# Patient Record
Sex: Female | Born: 1975 | Hispanic: No | Marital: Married | Smoking: Never smoker
Health system: Southern US, Community
[De-identification: ages and names within clinical notes are randomized; demographics above are authoritative.]

## PROBLEM LIST (undated history)

## (undated) DIAGNOSIS — R002 Palpitations: Secondary | ICD-10-CM

## (undated) DIAGNOSIS — Z973 Presence of spectacles and contact lenses: Secondary | ICD-10-CM

## (undated) DIAGNOSIS — R42 Dizziness and giddiness: Secondary | ICD-10-CM

## (undated) DIAGNOSIS — Z8639 Personal history of other endocrine, nutritional and metabolic disease: Secondary | ICD-10-CM

## (undated) DIAGNOSIS — G253 Myoclonus: Secondary | ICD-10-CM

## (undated) DIAGNOSIS — K219 Gastro-esophageal reflux disease without esophagitis: Secondary | ICD-10-CM

## (undated) DIAGNOSIS — R519 Headache, unspecified: Secondary | ICD-10-CM

## (undated) DIAGNOSIS — N9489 Other specified conditions associated with female genital organs and menstrual cycle: Secondary | ICD-10-CM

## (undated) DIAGNOSIS — I82409 Acute embolism and thrombosis of unspecified deep veins of unspecified lower extremity: Secondary | ICD-10-CM

## (undated) DIAGNOSIS — R51 Headache: Secondary | ICD-10-CM

## (undated) DIAGNOSIS — E039 Hypothyroidism, unspecified: Secondary | ICD-10-CM

## (undated) DIAGNOSIS — E079 Disorder of thyroid, unspecified: Secondary | ICD-10-CM

## (undated) DIAGNOSIS — N920 Excessive and frequent menstruation with regular cycle: Secondary | ICD-10-CM

## (undated) DIAGNOSIS — E559 Vitamin D deficiency, unspecified: Secondary | ICD-10-CM

## (undated) HISTORY — DX: Vitamin D deficiency, unspecified: E55.9

## (undated) HISTORY — DX: Headache: R51

## (undated) HISTORY — DX: Headache, unspecified: R51.9

## (undated) HISTORY — DX: Dizziness and giddiness: R42

## (undated) HISTORY — DX: Myoclonus: G25.3

## (undated) HISTORY — DX: Disorder of thyroid, unspecified: E07.9

---
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
## 1997-05-21 HISTORY — PX: INGUINAL HERNIA REPAIR: SUR1180

## 2003-03-06 ENCOUNTER — Emergency Department (HOSPITAL_COMMUNITY): Admission: EM | Admit: 2003-03-06 | Discharge: 2003-03-06 | Payer: Self-pay | Admitting: Emergency Medicine

## 2003-04-21 ENCOUNTER — Other Ambulatory Visit: Admission: RE | Admit: 2003-04-21 | Discharge: 2003-04-21 | Payer: Self-pay | Admitting: Obstetrics and Gynecology

## 2003-11-11 ENCOUNTER — Inpatient Hospital Stay (HOSPITAL_COMMUNITY): Admission: AD | Admit: 2003-11-11 | Discharge: 2003-11-11 | Payer: Self-pay | Admitting: Obstetrics and Gynecology

## 2003-11-24 ENCOUNTER — Inpatient Hospital Stay (HOSPITAL_COMMUNITY): Admission: AD | Admit: 2003-11-24 | Discharge: 2003-11-28 | Payer: Self-pay | Admitting: Obstetrics and Gynecology

## 2006-07-22 ENCOUNTER — Encounter: Admission: RE | Admit: 2006-07-22 | Discharge: 2006-07-22 | Payer: Self-pay | Admitting: Family Medicine

## 2011-01-08 ENCOUNTER — Other Ambulatory Visit: Payer: Self-pay | Admitting: Family Medicine

## 2011-01-08 DIAGNOSIS — N644 Mastodynia: Secondary | ICD-10-CM

## 2011-01-12 ENCOUNTER — Ambulatory Visit
Admission: RE | Admit: 2011-01-12 | Discharge: 2011-01-12 | Disposition: A | Discharge status: Home / Self Care | Payer: BC Managed Care – PPO | Source: Ambulatory Visit | Attending: Family Medicine | Admitting: Family Medicine

## 2011-01-12 DIAGNOSIS — N644 Mastodynia: Secondary | ICD-10-CM

## 2011-10-26 ENCOUNTER — Ambulatory Visit: Payer: BC Managed Care – PPO | Admitting: Physical Therapy

## 2011-11-05 ENCOUNTER — Ambulatory Visit: Payer: BC Managed Care – PPO | Admitting: Physical Therapy

## 2013-02-12 ENCOUNTER — Other Ambulatory Visit (HOSPITAL_COMMUNITY): Payer: Self-pay | Admitting: Endocrinology

## 2013-02-12 DIAGNOSIS — E0590 Thyrotoxicosis, unspecified without thyrotoxic crisis or storm: Secondary | ICD-10-CM

## 2013-02-18 ENCOUNTER — Encounter (HOSPITAL_COMMUNITY): Payer: Self-pay | Admitting: *Deleted

## 2013-03-04 ENCOUNTER — Ambulatory Visit (HOSPITAL_COMMUNITY): Payer: BC Managed Care – PPO

## 2013-03-05 ENCOUNTER — Other Ambulatory Visit (HOSPITAL_COMMUNITY): Payer: BC Managed Care – PPO

## 2013-03-11 ENCOUNTER — Encounter (HOSPITAL_COMMUNITY): Payer: Self-pay | Admitting: Pharmacist

## 2013-03-12 ENCOUNTER — Encounter (HOSPITAL_COMMUNITY)
Admission: RE | Admit: 2013-03-12 | Discharge: 2013-03-12 | Disposition: A | Discharge status: Home / Self Care | Payer: BC Managed Care – PPO | Source: Ambulatory Visit | Attending: Endocrinology | Admitting: Endocrinology

## 2013-03-12 DIAGNOSIS — E0590 Thyrotoxicosis, unspecified without thyrotoxic crisis or storm: Secondary | ICD-10-CM | POA: Insufficient documentation

## 2013-03-13 ENCOUNTER — Other Ambulatory Visit: Payer: Self-pay | Admitting: Obstetrics and Gynecology

## 2013-03-13 ENCOUNTER — Encounter (HOSPITAL_COMMUNITY)
Admission: RE | Admit: 2013-03-13 | Discharge: 2013-03-13 | Disposition: A | Discharge status: Home / Self Care | Payer: BC Managed Care – PPO | Source: Ambulatory Visit | Attending: Endocrinology | Admitting: Endocrinology

## 2013-03-13 MED ORDER — SODIUM IODIDE I 131 CAPSULE
14.8000 | Freq: Once | INTRAVENOUS | Status: AC | PRN
  Administered 2013-03-13: 14.8 via ORAL

## 2013-03-13 MED ORDER — SODIUM PERTECHNETATE TC 99M INJECTION
10.8000 | Freq: Once | INTRAVENOUS | Status: AC | PRN
Start: 2013-03-13 — End: 2013-03-13
  Administered 2013-03-13: 11 via INTRAVENOUS

## 2013-03-14 ENCOUNTER — Observation Stay (HOSPITAL_COMMUNITY)
Admission: EM | Admit: 2013-03-14 | Discharge: 2013-03-16 | Disposition: A | Discharge status: Home / Self Care | Payer: BC Managed Care – PPO | Attending: General Surgery | Admitting: General Surgery

## 2013-03-14 ENCOUNTER — Encounter (HOSPITAL_COMMUNITY): Payer: Self-pay | Admitting: Emergency Medicine

## 2013-03-14 DIAGNOSIS — K612 Anorectal abscess: Principal | ICD-10-CM | POA: Insufficient documentation

## 2013-03-14 DIAGNOSIS — K611 Rectal abscess: Secondary | ICD-10-CM

## 2013-03-14 DIAGNOSIS — K649 Unspecified hemorrhoids: Secondary | ICD-10-CM | POA: Insufficient documentation

## 2013-03-14 DIAGNOSIS — E0590 Thyrotoxicosis, unspecified without thyrotoxic crisis or storm: Secondary | ICD-10-CM | POA: Insufficient documentation

## 2013-03-14 LAB — CBC
MCH: 29.6 pg (ref 26.0–34.0)
MCHC: 34.7 g/dL (ref 30.0–36.0)
MCV: 85.2 fL (ref 78.0–100.0)
RBC: 4.67 MIL/uL (ref 3.87–5.11)

## 2013-03-14 LAB — BASIC METABOLIC PANEL
BUN: 7 mg/dL (ref 6–23)
Calcium: 9.5 mg/dL (ref 8.4–10.5)
GFR calc Af Amer: >90 mL/min (ref >90)
Sodium: 136 mEq/L (ref 135–145)

## 2013-03-14 MED ORDER — SODIUM CHLORIDE 0.9 % IV SOLN
3.0000 g | Freq: Four times a day (QID) | INTRAVENOUS | Status: DC
  Administered 2013-03-14 – 2013-03-16 (×6): 3 g via INTRAVENOUS
  Filled 2013-03-14 (×9): qty 3

## 2013-03-14 MED ORDER — SODIUM CHLORIDE 0.9 % IV SOLN
INTRAVENOUS | Status: DC
  Administered 2013-03-14: 21:00:00 via INTRAVENOUS

## 2013-03-14 MED ORDER — OXYCODONE HCL 5 MG PO TABS
5.0000 mg | ORAL_TABLET | ORAL | Status: DC | PRN
  Administered 2013-03-15 – 2013-03-16 (×2): 5 mg via ORAL
  Filled 2013-03-14 (×2): qty 1

## 2013-03-14 MED ORDER — MORPHINE SULFATE 4 MG/ML IJ SOLN
4.0000 mg | Freq: Once | INTRAMUSCULAR | Status: AC
  Administered 2013-03-14: 4 mg via INTRAMUSCULAR
  Filled 2013-03-14: qty 1

## 2013-03-14 MED ORDER — MORPHINE SULFATE 2 MG/ML IJ SOLN: 1.0000 mg | INTRAMUSCULAR | Status: DC | PRN

## 2013-03-14 NOTE — H&P (Signed)
Reason for Consult:perirectal abscess Referring Physician: Sharilyn Sites, PA  Jo Mitchell is an 37 y.o. female.  HPI: was asked to evaluate this patient for possible perirectal abscess. 10 years ago she says that she had a perirectal abscess which required incision and drainage. This relieved her symptoms but she had persistent drainage from the I&D site consistent with a fistula in ano.  With nonoperative management this eventually closed and she has not had any symptoms since then. On Monday she had exquisite pain after a bowel movement and she saw her primary care physician and she was told that she had an anal fissure. She denies any blood in the stools or other rectal bleeding. She has had intermittent pain after bowel movements throughout the week. Her most severe pain began earlier this morning to the point which brought her to the emergency room. She says that this is similar pain that she had with her prior abscess. She denies any fevers or chills.  Past Medical History  Diagnosis Date  . Hyperthyroidism     pt not taking meds at this time.    Past Surgical History  Procedure Laterality Date  . Cesarean section    . Hernia repair      History reviewed. No pertinent family history.  Social History:  reports that she has never smoked. She does not have any smokeless tobacco history on file. She reports that she does not drink alcohol or use illicit drugs.  Allergies:  Allergies  Allergen Reactions  . Azithromycin Other (See Comments)    Dizziness, palpitations    Medications: I have reviewed the patient's current medications.  No results found for this or any previous visit (from the past 48 hour(s)).  Nm Thyroid Sng Uptake W/imaging  03/13/2013   CLINICAL DATA:  Thyrotoxicosis. TSH 0.09.  EXAM: THYROID SCAN AND UPTAKE - 24 HOURS  TECHNIQUE: Following the per oral administration of I-131 sodium iodide, the patient returned at 24 hours and uptake measurements were acquired  with the uptake probe centered on the neck. Thyroid imaging was performed following the intravenous administration of the Tc-10m Pertechnetate.  COMPARISON:  None.  RADIOPHARMACEUTICALS:  14.8 micro Ci I-131 Sodium Iodide and 10.8 mCi TC-31m Pertechnetate  FINDINGS: The 24 hr uptake by the thyroid gland is 0.6%. Normal 24 hr uptake is 10-30 %.  On thyroid imaging, the thyroid uptake appears diffusely decreased. No focal hot or cold nodules are identified. There is probably some swallowed activity within the esophagus.  IMPRESSION: Markedly diminished 24 uptake by the thyroid gland without focal abnormality on thyroid imaging. Findings suggest thyroiditis. Correlate clinically.   Electronically Signed   By: Roxy Horseman M.D.   On: 03/13/2013 15:19    ROS All other review of systems negative or noncontributory except as stated in the HPI  Blood pressure 128/68, pulse 88, temperature 98.6 F (37 C), temperature source Oral, resp. rate 14, last menstrual period 03/13/2013, SpO2 100.00%. General appearance: alert, cooperative and no distress Head: Normocephalic, without obvious abnormality, atraumatic Neck: no JVD and supple, symmetrical, trachea midline Resp: clear to auscultation bilaterally Cardio: regular rate and rhythm, S1, S2 normal, no murmur, click, rub or gallop GI: soft, non-tender; bowel sounds normal; no masses,  no organomegaly and right lateral hemorrhoid, small area of fluctuance near the base of the hemorrhoid and in the area of scar from prior I/D,  DRE tender and purulence expressed from anus consistent with internal drainage Extremities: extremities normal, atraumatic, no cyanosis or edema Pulses:  2+ and symmetric Skin: Skin color, texture, turgor normal. No rashes or lesions Neurologic: Grossly normal  Assessment/Plan: Perirectal abscess Her symptoms sound more like an anal fissure with the pain after each bowel movement but I do not appreciate any fissure on exam. She has some  fluctuance and tenderness with some purulence coming from the anal canal concerning for perirectal abscess. I have recommended to her formal rectal exam under anesthesia with incision and drainage of her perirectal abscess however she has eaten recently.  I have recommended admission to the hospital with IV antibiotics and we will plan for formal exam under anesthesia with incision and drainage of this abscess. I discussed with her the procedure and the risks. We discussed the risk of persistent infection, bleeding, pain, scarring, recurrence, fistula and possible need for repeat procedures, and injury to the sphincter complex and she expressed understanding and desires to proceed with the recommended procedure  Banks Chaikin DAVID 03/14/2013, 5:58 PM

## 2013-03-14 NOTE — ED Provider Notes (Signed)
CSN: [id number]     Arrival date & time 03/14/13  1608 History   First MD Initiated Contact with Patient 03/14/13 1629     Chief Complaint  Patient presents with  . Abscess   (Consider location/radiation/quality/duration/timing/severity/associated sxs/prior Treatment) The history is provided by the patient and medical records.   This is a 37 year old female with past medical history significant for hyperthyroidism, presenting to the ED for rectal pain. States she has had pain in the area for over a week. She saw her primary care physician on Monday and was told she had a fissure but no rectal abscess. She states pain has increased and feels that her right inner buttock is "hard".  Pain worse with bowel movements.  Denies any drainage or bleeding from the peri-rectal area or rectum.  No fevers, sweats, or chills.  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  Past Medical History  Diagnosis Date  . Hyperthyroidism     pt not taking meds at this time.   Past Surgical History  Procedure Laterality Date  . Cesarean section    . Hernia repair     History reviewed. No pertinent family history. History  Substance Use Topics  . Smoking status: Never Smoker   . Smokeless tobacco: Not on file  . Alcohol Use: No   OB History   Grav Para Term Preterm Abortions TAB SAB Ect Mult Living                 Review of Systems  Gastrointestinal: Positive for rectal pain.  All other systems reviewed and are negative.    Allergies  Azithromycin  Home Medications   Current Outpatient Rx  Name  Route  Sig  Dispense  Refill  . Lactobacillus-Inulin (CULTURELLE DIGESTIVE HEALTH) CAPS   Oral   Take 1 capsule by mouth at bedtime.         . ranitidine (ZANTAC) 150 MG tablet   Oral   Take 150 mg by mouth 2 (two) times daily as needed for heartburn.          BP 128/68  Pulse 88  Temp(Src) 98.6 F (37 C) (Oral)  Resp 14  SpO2  100%  LMP 03/13/2013  Physical Exam  Nursing note and vitals reviewed. Constitutional: She is oriented to person, place, and time. She appears well-developed and well-nourished. No distress.  HENT:  Head: Normocephalic and atraumatic.  Mouth/Throat: Oropharynx is clear and moist.  Eyes: Conjunctivae and EOM are normal. Pupils are equal, round, and reactive to light.  Neck: Normal range of motion.  Cardiovascular: Normal rate, regular rhythm and normal heart sounds.   Pulmonary/Chest: Effort normal and breath sounds normal. No respiratory distress. She has no wheezes.  Genitourinary: Rectal exam shows external hemorrhoid and tenderness. Rectal exam shows no fissure.     Single, non-thrombosed, non-bleeding external hemorrhoid; no fissures noted  Musculoskeletal: Normal range of motion. She exhibits no edema.  Neurological: She is alert and oriented to person, place, and time.  Skin: Skin is warm and dry. She is not diaphoretic.  Psychiatric: She has a normal mood and affect.    ED Course  Procedures (including critical care time) Labs Review Labs Reviewed - No data to display Imaging Review   EKG Interpretation   None       MDM   1. Peri-rectal abscess    PE findings consistent with peri-rectal abscess.  Consulted general surgery, Dr. Biagio Quint, who  has evaluated pt and agrees with assessment.  Will admit for abx overnight and formal drainage in the morning.  VS stable.  Garlon Hatchet, PA-C 03/14/13 1820

## 2013-03-14 NOTE — ED Notes (Signed)
She c/o pain at inner right buttock.  She had pain at rectal area, for which she saw her pcp this Mon.; and was advised she had a fissure-in-ano, but no abscess.  She states the pain and pressure at buttock area and is confident she now has an abscess.  She denies fever, and is in no distress.

## 2013-03-14 NOTE — ED Provider Notes (Signed)
Medical screening examination/treatment/procedure(s) were performed by non-physician practitioner and as supervising physician I was immediately available for consultation/collaboration.  Shahiem Bedwell T Seanpatrick Maisano, MD 03/14/13 2244 

## 2013-03-14 NOTE — ED Notes (Signed)
Bed: WA01 Expected date: 03/14/13 Expected time: 4:27 PM Means of arrival: Ambulance Comments: Nause/dizzy

## 2013-03-14 NOTE — Progress Notes (Signed)
Pt arrived to floor  Room 1517 via wheelchair . VS taken, pt oriented to room with no complications. Steady gait and pain 4/10. Initial assessment complete. Pt will be monitored throughout shift.

## 2013-03-15 ENCOUNTER — Inpatient Hospital Stay (HOSPITAL_COMMUNITY): Payer: BC Managed Care – PPO | Admitting: Anesthesiology

## 2013-03-15 ENCOUNTER — Encounter (HOSPITAL_COMMUNITY): Admission: EM | Disposition: A | Discharge status: Home / Self Care | Payer: Self-pay | Source: Home / Self Care

## 2013-03-15 ENCOUNTER — Encounter (HOSPITAL_COMMUNITY): Payer: BC Managed Care – PPO | Admitting: Anesthesiology

## 2013-03-15 DIAGNOSIS — K612 Anorectal abscess: Secondary | ICD-10-CM

## 2013-03-15 HISTORY — PX: INCISION AND DRAINAGE PERIRECTAL ABSCESS: SHX1804

## 2013-03-15 SURGERY — INCISION AND DRAINAGE, ABSCESS, PERIRECTAL
Anesthesia: General | Site: Rectum | Wound class: Dirty or Infected

## 2013-03-15 MED ORDER — LACTATED RINGERS IV SOLN
INTRAVENOUS | Status: DC | PRN
  Administered 2013-03-15 (×2): via INTRAVENOUS

## 2013-03-15 MED ORDER — PROPOFOL 10 MG/ML IV BOLUS
INTRAVENOUS | Status: DC | PRN
  Administered 2013-03-15: 130 mg via INTRAVENOUS

## 2013-03-15 MED ORDER — LACTATED RINGERS IV SOLN
INTRAVENOUS | Status: DC
  Administered 2013-03-15: 12:00:00 via INTRAVENOUS

## 2013-03-15 MED ORDER — LIDOCAINE HCL (CARDIAC) 20 MG/ML IV SOLN
INTRAVENOUS | Status: DC | PRN
  Administered 2013-03-15: 100 mg via INTRAVENOUS

## 2013-03-15 MED ORDER — PROMETHAZINE HCL 25 MG/ML IJ SOLN
6.2500 mg | Freq: Once | INTRAMUSCULAR | Status: AC
  Administered 2013-03-15: 6.25 mg via INTRAVENOUS

## 2013-03-15 MED ORDER — DEXAMETHASONE SODIUM PHOSPHATE 10 MG/ML IJ SOLN
INTRAMUSCULAR | Status: DC | PRN
  Administered 2013-03-15: 10 mg via INTRAVENOUS

## 2013-03-15 MED ORDER — BUPIVACAINE-EPINEPHRINE 0.25% -1:200000 IJ SOLN
INTRAMUSCULAR | Status: DC | PRN
  Administered 2013-03-15: 30 mL

## 2013-03-15 MED ORDER — PROMETHAZINE HCL 25 MG/ML IJ SOLN
INTRAMUSCULAR | Status: AC
  Filled 2013-03-15: qty 1

## 2013-03-15 MED ORDER — DOCUSATE SODIUM 100 MG PO CAPS
100.0000 mg | ORAL_CAPSULE | Freq: Two times a day (BID) | ORAL | Status: DC | PRN
  Filled 2013-03-15: qty 1

## 2013-03-15 MED ORDER — SUCCINYLCHOLINE CHLORIDE 20 MG/ML IJ SOLN
INTRAMUSCULAR | Status: DC | PRN
  Administered 2013-03-15: 100 mg via INTRAVENOUS

## 2013-03-15 MED ORDER — FENTANYL CITRATE 0.05 MG/ML IJ SOLN
INTRAMUSCULAR | Status: DC | PRN
  Administered 2013-03-15 (×2): 50 ug via INTRAVENOUS

## 2013-03-15 MED ORDER — BUPIVACAINE-EPINEPHRINE PF 0.25-1:200000 % IJ SOLN
INTRAMUSCULAR | Status: AC
  Filled 2013-03-15: qty 30

## 2013-03-15 MED ORDER — MIDAZOLAM HCL 5 MG/5ML IJ SOLN
INTRAMUSCULAR | Status: DC | PRN
  Administered 2013-03-15: 2 mg via INTRAVENOUS

## 2013-03-15 MED ORDER — FENTANYL CITRATE 0.05 MG/ML IJ SOLN: 25.0000 ug | INTRAMUSCULAR | Status: DC | PRN

## 2013-03-15 MED ORDER — ONDANSETRON HCL 4 MG/2ML IJ SOLN
INTRAMUSCULAR | Status: DC | PRN
  Administered 2013-03-15: 4 mg via INTRAVENOUS

## 2013-03-15 SURGICAL SUPPLY — 34 items
BLADE SURG 15 STRL LF DISP TIS (BLADE) ×1 IMPLANT
BLADE SURG 15 STRL SS (BLADE) ×2
BRIEF STRETCH FOR OB PAD LRG (UNDERPADS AND DIAPERS) ×2 IMPLANT
CANISTER SUCTION 2500CC (MISCELLANEOUS) ×2 IMPLANT
CLOTH BEACON ORANGE TIMEOUT ST (SAFETY) ×1 IMPLANT
COVER SURGICAL LIGHT HANDLE (MISCELLANEOUS) ×1 IMPLANT
DECANTER SPIKE VIAL GLASS SM (MISCELLANEOUS) ×1 IMPLANT
DRAIN PENROSE 18X1/2 LTX STRL (DRAIN) IMPLANT
DRAPE LAPAROTOMY T 102X78X121 (DRAPES) ×1 IMPLANT
DRAPE LG THREE QUARTER DISP (DRAPES) ×1 IMPLANT
ELECT REM PT RETURN 9FT ADLT (ELECTROSURGICAL) ×2
ELECTRODE REM PT RTRN 9FT ADLT (ELECTROSURGICAL) ×1 IMPLANT
GAUZE SPONGE 4X4 16PLY XRAY LF (GAUZE/BANDAGES/DRESSINGS) ×2 IMPLANT
GLOVE BIOGEL PI IND STRL 7.0 (GLOVE) ×1 IMPLANT
GLOVE BIOGEL PI INDICATOR 7.0 (GLOVE) ×1
GLOVE SURG SS PI 7.5 STRL IVOR (GLOVE) ×7 IMPLANT
GOWN PREVENTION PLUS LG XLONG (DISPOSABLE) ×2 IMPLANT
GOWN STRL REIN XL XLG (GOWN DISPOSABLE) ×4 IMPLANT
KIT BASIN OR (CUSTOM PROCEDURE TRAY) ×2 IMPLANT
LUBRICANT JELLY K Y 4OZ (MISCELLANEOUS) ×2 IMPLANT
NDL SAFETY ECLIPSE 18X1.5 (NEEDLE) ×1 IMPLANT
NEEDLE HYPO 18GX1.5 SHARP (NEEDLE) ×2
NEEDLE HYPO 22GX1.5 SAFETY (NEEDLE) ×2 IMPLANT
NS IRRIG 1000ML POUR BTL (IV SOLUTION) ×2 IMPLANT
PACK BASIC VI WITH GOWN DISP (CUSTOM PROCEDURE TRAY) ×1 IMPLANT
PACK GENERAL/GYN (CUSTOM PROCEDURE TRAY) ×2 IMPLANT
PENCIL BUTTON HOLSTER BLD 10FT (ELECTRODE) ×2 IMPLANT
SPONGE GAUZE 4X4 12PLY (GAUZE/BANDAGES/DRESSINGS) ×1 IMPLANT
SPONGE LAP 4X18 X RAY DECT (DISPOSABLE) ×1 IMPLANT
SPONGE SURGIFOAM ABS GEL 100 (HEMOSTASIS) IMPLANT
SYR 20CC LL (SYRINGE) ×2 IMPLANT
SYR CONTROL 10ML LL (SYRINGE) ×3 IMPLANT
TOWEL OR 17X26 10 PK STRL BLUE (TOWEL DISPOSABLE) ×3 IMPLANT
YANKAUER SUCT BULB TIP 10FT TU (MISCELLANEOUS) ×2 IMPLANT

## 2013-03-15 NOTE — Op Note (Signed)
NAMECHALISE, Jo Mitchell NO.:  [id number]  MEDICAL RECORD NO.:  [id number]  LOCATION:  1517                         FACILITY:  [facility]  PHYSICIAN:  Lodema Pilot, MD       DATE OF BIRTH:  04-15-1976  DATE OF PROCEDURE:  03/15/2013 DATE OF DISCHARGE:                              OPERATIVE REPORT   PROCEDURE:  Rectal exam under anesthesia with incision and drainage of perirectal abscess.  PREOPERATIVE DIAGNOSIS:  Perirectal abscess.  POSTOPERATIVE DIAGNOSIS:  Perirectal abscess.  SURGEON:  Lodema Pilot, MD  ASSISTANT:  None.  ANESTHESIA:  General endotracheal anesthesia.  FLUIDS:  1 L crystalloid.  ESTIMATED BLOOD LOSS:  Minimal.  DRAINS:  None.  SPECIMENS:  None.  COMPLICATIONS:  None apparent.  FINDINGS:  A 2-cm perirectal abscess draining internally, no other obvious abscess collections.  Known chronic hemorrhoids at the right posterior column.  INDICATION FOR PROCEDURE:  Jo Mitchell is a 37 year old female with previous perirectal abscess 10 years ago which required incision and drainage.  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  She was noted to have some purulence draining internally on exam.  OPERATIVE DETAIL:  Jo Mitchell was seen and evaluated in the surgical ward and risks and benefits of the procedure were again discussed in lay terms.  Informed consent was obtained.  She was already on therapeutic antibiotics and she was taken to operating room and general endotracheal anesthesia obtained.  She was then flipped to the prone jack-knife position and her buttocks were taped laterally.  Her buttock region was prepped and draped in a standard surgical fashion.  Procedure time-out was performed with all operative team members to confirm proper patient, procedure, and digital rectal exam  was performed, which demonstrated some purulent material but no internal masses.  I placed an anal speculum and inspected internally.  She was noted to have some small opening internally at the level of the sphincter muscles, which was draining purulent material.  I placed a right angle in through this opening, taking care to avoid injury to the adjacent sphincter muscles and I could palpate the tip of the right angle externally at the area of fluctuance at the right anterior aspect of the perianal region.  I opened up the internal opening bluntly and expressed any residual purulence, although there was not much residual purulence identified.  I palpated the remainder of the perianal and perirectal area.  The remainder of the area appeared soft except within this area in the right anterior region which was indurated.  Externally, I could appreciate fluctuant area and this was at the tip of the right angle which appear to communicate to the internal collection.  I probed the area with the 18-gauge needle to see if I could express withdraw of any additional purulent fluid and none was returned.  I feel that this was completely drained internally and I felt that it would be adequate drainage from the internal opening and by making external opening, I would put her at high risk  for fistula and I did not feel that this was needed given the internal drainage.  Again, I washed out the cavity with sterile saline solution and again palpated the area and no further purulent material was expressed.  I injected the perianal area with 30 mL of 0.25% Marcaine with epinephrine, and the anal speculum was removed.  The wound was noted be hemostatic and all sponge, needle, and instrument counts were correct at the end of case, and the patient tolerated the procedure well without apparent complication.  She was hemodynamically stable and ready for transfer to the recovery room in stable condition.           ______________________________ Lodema Pilot, MD     BL/MEDQ  D:  03/15/2013  T:  03/15/2013  Job:  [id number]

## 2013-03-15 NOTE — Anesthesia Postprocedure Evaluation (Signed)
  Anesthesia Post-op Note  Patient: Jo Mitchell  Procedure(s) Performed: Procedure(s) (LRB): IRRIGATION AND DEBRIDEMENT PERIRECTAL ABSCESS (N/A)  Patient Location: PACU  Anesthesia Type: General  Level of Consciousness: awake and alert   Airway and Oxygen Therapy: Patient Spontanous Breathing  Post-op Pain: mild  Post-op Assessment: Post-op Vital signs reviewed, Patient's Cardiovascular Status Stable, Respiratory Function Stable, Patent Airway and No signs of Nausea or vomiting  Last Vitals:  Filed Vitals:   03/15/13 1040  BP:   Pulse: 84  Temp:   Resp: 21    Post-op Vital Signs: stable   Complications: No apparent anesthesia complications

## 2013-03-15 NOTE — Anesthesia Preprocedure Evaluation (Addendum)
Anesthesia Evaluation  Patient identified by MRN, date of birth, ID band Patient awake    Reviewed: Allergy & Precautions, H&P , NPO status , Patient's Chart, lab work & pertinent test results  Airway Mallampati: II TM Distance: >3 FB Neck ROM: full    Dental no notable dental hx. (+) Teeth Intact and Dental Advisory Given   Pulmonary neg pulmonary ROS,  breath sounds clear to auscultation  Pulmonary exam normal       Cardiovascular Exercise Tolerance: Good negative cardio ROS  Rhythm:regular Rate:Normal     Neuro/Psych negative neurological ROS  negative psych ROS   GI/Hepatic negative GI ROS, Neg liver ROS,   Endo/Other  Hyperthyroidism History hyperthyroidism.  Not taking meds at this time.  Renal/GU negative Renal ROS  negative genitourinary   Musculoskeletal   Abdominal   Peds  Hematology negative hematology ROS (+)   Anesthesia Other Findings   Reproductive/Obstetrics negative OB ROS                          Anesthesia Physical Anesthesia Plan  ASA: III and emergent  Anesthesia Plan: General   Post-op Pain Management:    Induction: Intravenous  Airway Management Planned: Oral ETT  Additional Equipment:   Intra-op Plan:   Post-operative Plan: Extubation in OR  Informed Consent: I have reviewed the patients History and Physical, chart, labs and discussed the procedure including the risks, benefits and alternatives for the proposed anesthesia with the patient or authorized representative who has indicated his/her understanding and acceptance.   Dental Advisory Given  Plan Discussed with: CRNA and Surgeon  Anesthesia Plan Comments:        Anesthesia Quick Evaluation

## 2013-03-15 NOTE — Brief Op Note (Signed)
03/14/2013 - 03/15/2013  10:32 AM  PATIENT:  Jo Mitchell  37 y.o. female  PRE-OPERATIVE DIAGNOSIS:  rectal abscess  POST-OPERATIVE DIAGNOSIS:  rectal abscess  PROCEDURE:  Procedure(s): IRRIGATION AND DEBRIDEMENT PERIRECTAL ABSCESS (N/A)  SURGEON:  Surgeon(s) and Role:    * Lodema Pilot, DO - Primary  PHYSICIAN ASSISTANT:   ASSISTANTS: none   ANESTHESIA:   general  EBL:  Total I/O In: 1000 [I.V.:1000] Out: -   BLOOD ADMINISTERED:none  DRAINS: none   LOCAL MEDICATIONS USED:  MARCAINE     SPECIMEN:  No Specimen  DISPOSITION OF SPECIMEN:  N/A  COUNTS:  YES  TOURNIQUET:  * No tourniquets in log *  DICTATION: .Other Dictation: Dictation Number dictated  PLAN OF CARE: Admit for overnight observation  PATIENT DISPOSITION:  PACU - hemodynamically stable.   Delay start of Pharmacological VTE agent (>24hrs) due to surgical blood loss or risk of bleeding: no

## 2013-03-15 NOTE — Transfer of Care (Signed)
Immediate Anesthesia Transfer of Care Note  Patient: Jo Mitchell  Procedure(s) Performed: Procedure(s): IRRIGATION AND DEBRIDEMENT PERIRECTAL ABSCESS (N/A)  Patient Location: PACU  Anesthesia Type:General  Level of Consciousness: sedated  Airway & Oxygen Therapy: Patient Spontanous Breathing and Patient connected to face mask oxygen  Post-op Assessment: Report given to PACU RN and Post -op Vital signs reviewed and stable  Post vital signs: Reviewed and stable  Complications: No apparent anesthesia complications

## 2013-03-15 NOTE — Progress Notes (Signed)
Day of Surgery  Subjective: Feeling better this am  Objective: Vital signs in last 24 hours: Temp:  [97.7 F (36.5 C)-98.6 F (37 C)] 98.1 F (36.7 C) (10/26 0557) Pulse Rate:  [57-88] 66 (10/26 0557) Resp:  [14-16] 16 (10/26 0557) BP: (83-128)/(50-68) 86/55 mmHg (10/26 0557) SpO2:  [98 %-100 %] 98 % (10/26 0557) Weight:  [147 lb 4.3 oz (66.8 kg)] 147 lb 4.3 oz (66.8 kg) (10/25 2044) Last BM Date: 03/14/13  Intake/Output from previous day: 10/25 0701 - 10/26 0700 In: 903 [I.V.:903] Out: 400 [Urine:400] Intake/Output this shift:    General appearance: alert, cooperative and no distress Resp: nonlabored Cardio: normal rate, regular  Lab Results:   Recent Labs  03/14/13 1840  WBC 8.7  HGB 13.8  HCT 39.8  PLT 335   BMET  Recent Labs  03/14/13 1840  NA 136  K 3.8  CL 102  CO2 25  GLUCOSE 97  BUN 7  CREATININE 0.66  CALCIUM 9.5   PT/INR No results found for this basename: LABPROT, INR,  in the last 72 hours ABG No results found for this basename: PHART, PCO2, PO2, HCO3,  in the last 72 hours  Studies/Results: Nm Thyroid Sng Uptake W/imaging  03/13/2013   CLINICAL DATA:  Thyrotoxicosis. TSH 0.09.  EXAM: THYROID SCAN AND UPTAKE - 24 HOURS  TECHNIQUE: Following the per oral administration of I-131 sodium iodide, the patient returned at 24 hours and uptake measurements were acquired with the uptake probe centered on the neck. Thyroid imaging was performed following the intravenous administration of the Tc-52m Pertechnetate.  COMPARISON:  None.  RADIOPHARMACEUTICALS:  14.8 micro Ci I-131 Sodium Iodide and 10.8 mCi TC-73m Pertechnetate  FINDINGS: The 24 hr uptake by the thyroid gland is 0.6%. Normal 24 hr uptake is 10-30 %.  On thyroid imaging, the thyroid uptake appears diffusely decreased. No focal hot or cold nodules are identified. There is probably some swallowed activity within the esophagus.  IMPRESSION: Markedly diminished 24 uptake by the thyroid gland without  focal abnormality on thyroid imaging. Findings suggest thyroiditis. Correlate clinically.   Electronically Signed   By: Roxy Horseman M.D.   On: 03/13/2013 15:19    Anti-infectives: Anti-infectives   Start     Dose/Rate Route Frequency Ordered Stop   03/14/13 2100  Ampicillin-Sulbactam (UNASYN) 3 g in sodium chloride 0.9 % 100 mL IVPB     3 g 100 mL/hr over 60 Minutes Intravenous 4 times per day 03/14/13 2046        Assessment/Plan: s/p Procedure(s): IRRIGATION AND DEBRIDEMENT PERIRECTAL ABSCESS (N/A) feels better but still hurting.  NPO now, will plan for rectal EUA and possible I/D of abscess.  risks discussed with the patient of infection, bleeding, pain, scarring, recurrence, fistula, negative exam, need for repeat surgery, sphincter inury.  She desires to proceed with rectal EUA/IandD  LOS: 1 day    Lodema Pilot DAVID 03/15/2013

## 2013-03-15 NOTE — Progress Notes (Signed)
This shift attending, M .Lynch, made aware of patient's hypotensive BP readings.According to pt this is baseline. No new orders provided, will continue to monitor and intervene if systolic BP parameters fall below 80's, per M. Lynch.

## 2013-03-16 ENCOUNTER — Encounter (HOSPITAL_COMMUNITY): Payer: Self-pay | Admitting: General Surgery

## 2013-03-16 MED ORDER — OXYCODONE HCL 5 MG PO TABS: 5.0000 mg | ORAL_TABLET | ORAL | Status: DC | PRN

## 2013-03-16 MED ORDER — AMOXICILLIN-POT CLAVULANATE 875-125 MG PO TABS: 1.0000 | ORAL_TABLET | Freq: Two times a day (BID) | ORAL | Status: DC

## 2013-03-16 NOTE — Progress Notes (Signed)
Patient discharge home with husband, alert and oriented, discharge instructions given, patient and husband verbalize understanding, My Chart not access at this time will access at home, patient in stable condition at this time

## 2013-03-16 NOTE — Discharge Summary (Signed)
Patient ID: Phala Schraeder MRN: [id number] DOB/AGE: [birth year] 37 y.o.  Admit date: 03/14/2013 Discharge date: 03/16/2013  Procedures: incision and drainage of perirectal abscess  Consults: None  Reason for Admission: was asked to evaluate this patient for possible perirectal abscess. 10 years ago she says that she had a perirectal abscess which required incision and drainage. This relieved her symptoms but she had persistent drainage from the I&D site consistent with a fistula in ano. With nonoperative management this eventually closed and she has not had any symptoms since then. On Monday she had exquisite pain after a bowel movement and she saw her primary care physician and she was told that she had an anal fissure. She denies any blood in the stools or other rectal bleeding. She has had intermittent pain after bowel movements throughout the week. Her most severe pain began earlier this morning to the point which brought her to the emergency room. She says that this is similar pain that she had with her prior abscess. She denies any fevers or chills.  Admission Diagnoses:  1. Perirectal abscess 2. hyperthyroidism  Hospital Course: the patient was admitted and placed on IV Unasyn.  She was taken to the OR where she underwent an internal drainage of her perirectal abscess.  She tolerated this well.  On POD#1, the only pain she had was with a BM.  Otherwise, the patient is stable and ready for dc home.  PE: Rectal: no visible infection.  Drainage was done interiorly.  Unable to visualize  Discharge Diagnoses:  1. Perirectal abscess, s/p I&D 2. hyperthyroidism  Discharge Medications:   Medication List         amoxicillin-clavulanate 875-125 MG per tablet  Commonly known as:  AUGMENTIN  Take 1 tablet by mouth 2 (two) times daily.     CULTURELLE DIGESTIVE HEALTH Caps  Take 1 capsule by mouth at bedtime.     ibuprofen 200 MG tablet  Commonly known as:  ADVIL,MOTRIN  Take 200 mg by  mouth every 6 (six) hours as needed for pain.     oxyCODONE 5 MG immediate release tablet  Commonly known as:  Oxy IR/ROXICODONE  Take 1 tablet (5 mg total) by mouth every 4 (four) hours as needed.     ranitidine 150 MG tablet  Commonly known as:  ZANTAC  Take 150 mg by mouth 2 (two) times daily as needed for heartburn.        Discharge Instructions:     Follow-up Information   Follow up with LAYTON, BRIAN DAVID, DO. Schedule an appointment as soon as possible for a visit in 2 weeks.   Specialty:  General Surgery   Contact information:   [address] [place] [address] [phone]       Signed: Letha Cape 03/16/2013, 10:19 AM

## 2013-03-17 ENCOUNTER — Ambulatory Visit (INDEPENDENT_AMBULATORY_CARE_PROVIDER_SITE_OTHER): Payer: BC Managed Care – PPO | Admitting: General Surgery

## 2013-03-17 ENCOUNTER — Encounter (INDEPENDENT_AMBULATORY_CARE_PROVIDER_SITE_OTHER): Payer: Self-pay

## 2013-03-17 ENCOUNTER — Encounter (INDEPENDENT_AMBULATORY_CARE_PROVIDER_SITE_OTHER): Payer: Self-pay | Admitting: General Surgery

## 2013-03-17 VITALS — BP 108/62 | HR 68 | Temp 99.7°F | Resp 20 | Ht 63.75 in | Wt 135.6 lb

## 2013-03-17 DIAGNOSIS — K612 Anorectal abscess: Secondary | ICD-10-CM

## 2013-03-17 DIAGNOSIS — K611 Rectal abscess: Secondary | ICD-10-CM | POA: Insufficient documentation

## 2013-03-17 NOTE — Progress Notes (Signed)
Patient ID: Jo Mitchell, female   DOB: 04-27-1976, 37 y.o.   MRN: [id number] History:  This patient returns to the office 48 hours after incision and drainage of a recurrent perirectal abscess. She says the perianal area is feeling much better but she has right lateral gluteal pain she is worried that she has an infection. She is having bowel movements. Tolerating a diet. No fever. No chills. She is taking amoxicillin.  Record reveals that she was taken to the operating room on 03/15/2013 by Dr. Biagio Quint for incision and drainage of a perirectal abscess at the anal verge. She was discharged the next day and has otherwise been doing well  Exam: patient is a pleasant young [place] woman here with her husband. In no distress. Right gluteal area looks and feels normal. Skin healthy. No mass. No objective  tenderness. Pilonidal area looks normal. Perianal area revealed a slightly inflamed edematous hemorrhoid a little bit of purulence but no cellulitis or abscess.  Assessment: Right gluteal pain. Etiology unclear. No objective  evidence of infection or mass Recent perirectal abscess incision and drainage. This seems to be going well  Plan: Told thim that the options at this time were to wait and see since she seems to be doing well and the physical exam is negative, or to go head with a CT scan. They were reluctant to do the CT scan and preferred to wait and see Advised warm tub baths 4 times a day, continue the amoxicillin, and keep her appointment with Dr. Biagio Quint in the next 2 weeks. They were told to call if the pain worsens or she develops fever and we will do a CT scan.  Angelia Mould. Derrell Lolling, M.D., [facility] General and Minimally invasive Surgery Breast and Colorectal Surgery Office:   [phone] Pager:   [phone]

## 2013-03-17 NOTE — Patient Instructions (Signed)
I cannot identify a cause for your right buttock, or gluteal pain. I do not see any abnormal skin and I do not feel any significant tenderness or mass. You appear to be healing from the rectal abscess drainage well  The pain should slowly get better as you take the antibiotics and take warm tub baths.  If you develop  fever or worsening pain, call our office and you will be scheduled for a CT scan to make sure that we are not missing something.

## 2013-03-27 ENCOUNTER — Ambulatory Visit (INDEPENDENT_AMBULATORY_CARE_PROVIDER_SITE_OTHER): Payer: BC Managed Care – PPO | Admitting: General Surgery

## 2013-03-27 ENCOUNTER — Encounter (HOSPITAL_COMMUNITY): Admission: RE | Payer: Self-pay | Source: Ambulatory Visit

## 2013-03-27 ENCOUNTER — Other Ambulatory Visit (INDEPENDENT_AMBULATORY_CARE_PROVIDER_SITE_OTHER): Payer: Self-pay | Admitting: General Surgery

## 2013-03-27 ENCOUNTER — Encounter (INDEPENDENT_AMBULATORY_CARE_PROVIDER_SITE_OTHER): Payer: Self-pay | Admitting: General Surgery

## 2013-03-27 ENCOUNTER — Ambulatory Visit (HOSPITAL_COMMUNITY)
Admission: RE | Admit: 2013-03-27 | Payer: BC Managed Care – PPO | Source: Ambulatory Visit | Admitting: Obstetrics and Gynecology

## 2013-03-27 VITALS — BP 110/64 | HR 68 | Temp 98.9°F | Resp 14 | Ht 63.75 in | Wt 132.8 lb

## 2013-03-27 DIAGNOSIS — K612 Anorectal abscess: Secondary | ICD-10-CM

## 2013-03-27 DIAGNOSIS — K611 Rectal abscess: Secondary | ICD-10-CM

## 2013-03-27 DIAGNOSIS — Z4889 Encounter for other specified surgical aftercare: Secondary | ICD-10-CM

## 2013-03-27 DIAGNOSIS — N739 Female pelvic inflammatory disease, unspecified: Secondary | ICD-10-CM

## 2013-03-27 DIAGNOSIS — Z5189 Encounter for other specified aftercare: Secondary | ICD-10-CM

## 2013-03-27 SURGERY — DILATATION & CURETTAGE/HYSTEROSCOPY WITH RESECTOCOPE
Anesthesia: Choice

## 2013-03-27 MED ORDER — CIPROFLOXACIN HCL 500 MG PO TABS: 500.0000 mg | ORAL_TABLET | Freq: Two times a day (BID) | ORAL | Status: AC

## 2013-03-27 NOTE — Progress Notes (Signed)
Subjective:     Patient ID: Jo Mitchell, female   DOB: 1975/09/26, 37 y.o.   MRN: [id number]  HPI S/p I/D of perirectal abscess.  She feels better than preop but continues to have some right sided pain which she feels even to the tailbone region.  No fevers or chills.  She continues to have a small amount of purulent drainage.  She is off of her augmentin which she took for 7 days.  Review of Systems     Objective:   Physical Exam In the area just adjacent to the the right hemorrhoid, I see a pinpoint area of purulent drainage, the area of fluctuance feels better than prior exams.  There is no induration and she has minimal tenderness, no erythema     Assessment:     Perirectal abscess I do not appreciate another drainable collection.  She does have some scant purulence and this could be a fistula.  I recommended that we try another course of abx and see if this improves the drainage and pain over the next 2-3 days.  If no improvement, then we will set her up for CT pelvis to evaluate for undrained collection.  She will follow up with me next week.      Plan:     cipro 500 mg BID Ct pelvis Follow up next week

## 2013-04-01 ENCOUNTER — Other Ambulatory Visit: Payer: BC Managed Care – PPO

## 2013-04-01 ENCOUNTER — Encounter (INDEPENDENT_AMBULATORY_CARE_PROVIDER_SITE_OTHER): Payer: BC Managed Care – PPO | Admitting: General Surgery

## 2013-04-03 ENCOUNTER — Encounter (INDEPENDENT_AMBULATORY_CARE_PROVIDER_SITE_OTHER): Payer: Self-pay | Admitting: General Surgery

## 2013-04-03 ENCOUNTER — Ambulatory Visit (INDEPENDENT_AMBULATORY_CARE_PROVIDER_SITE_OTHER): Payer: BC Managed Care – PPO | Admitting: General Surgery

## 2013-04-03 VITALS — BP 112/66 | HR 68 | Temp 98.4°F | Resp 14 | Ht 63.75 in | Wt 133.0 lb

## 2013-04-03 DIAGNOSIS — K603 Anal fistula, unspecified: Secondary | ICD-10-CM

## 2013-04-03 NOTE — Progress Notes (Signed)
Subjective:     Patient ID: Jo Mitchell, female   DOB: 04/08/1976, 37 y.o.   MRN: [id number]  HPI This patient follows upstatus post incision and drainage of perirectal abscess which I drained internally. She did well initially blood return to the office with some pain in the area of the right buttocks as well as some drainage. She was placed on Cipro for a week and she says that her symptoms have improved since then. She actually had stopped with a drainage for about 3 days and then had recurrent drainage and seems to have intermittent drainage and mild discomfort. Should not have any fevers or chills and the pain and sacral pain has resolved. 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 Especially resolved spontaneously after about 4 months.  Review of Systems     Objective:   Physical Exam She is in no acute distress sitting comfortably on the table Anoscopic exam shows that the internal opening appears to be healed. She has small amount of excess skin and hemorrhoidal tissue in the right anterior lateral position and just external to this excess skin is a pinpoint opening with a small amount of clear drainage. This is a very tiny amount of pus and she does not have any fluctuance in the area. This was the area of concern for her abscess at the time of surgery. I'm concerned that she may have a small fistula.    Assessment:     Status post incision and drainage of perirectal abscess with possible anal fistula I think that the abscess is drained and I do not appreciate any fluctuance or induration in the area of the concern that similar to her prior incision and drainage she may have developed a small fistula. At this time we are planning for conservative management. However she would like to discuss surgical options especially since she is planning on going out of the country next month. She would like to discuss the  options for treatment with Dr. Maisie Fus and we will set her up to see her as soon as we can. In the meantime she will continue with her sitz baths and hygiene. She will call us back if she develops any increased pain or fevers or chills    Plan:     We will set her up to see Dr. Maisie Fus to discuss surgical options for anal fistula.

## 2013-04-06 ENCOUNTER — Telehealth (INDEPENDENT_AMBULATORY_CARE_PROVIDER_SITE_OTHER): Payer: Self-pay

## 2013-04-06 NOTE — Telephone Encounter (Signed)
Ok to try to work her into the schedule this week.  I don't anticipate the trip will be a problem.  We should be able to get a seton in by then.  Make sure she is doing sitz baths to help with the pain

## 2013-04-06 NOTE — Telephone Encounter (Signed)
The pt called reporting she thinks she has another perirectal abscess that has formed.  She has no fever but has pain.  She wants to only see Dr Maisie Fus now because she is a specialist.  She wants to know if she needs surgery or not.  She has travel plans for 12/11 that she can't cancel because she will lose $6000.  I told her I would have to talk to Dr Maisie Fus to see if she can be worked in or not

## 2013-04-06 NOTE — Telephone Encounter (Signed)
I called the pt and let her know that I have an opening that came up for the morning.  It is at 0900 and asked her to arrive at 0845.

## 2013-04-07 ENCOUNTER — Ambulatory Visit (INDEPENDENT_AMBULATORY_CARE_PROVIDER_SITE_OTHER): Payer: BC Managed Care – PPO | Admitting: General Surgery

## 2013-04-07 ENCOUNTER — Encounter (HOSPITAL_BASED_OUTPATIENT_CLINIC_OR_DEPARTMENT_OTHER): Payer: Self-pay | Admitting: *Deleted

## 2013-04-07 ENCOUNTER — Encounter (INDEPENDENT_AMBULATORY_CARE_PROVIDER_SITE_OTHER): Payer: Self-pay | Admitting: General Surgery

## 2013-04-07 ENCOUNTER — Telehealth (INDEPENDENT_AMBULATORY_CARE_PROVIDER_SITE_OTHER): Payer: Self-pay

## 2013-04-07 VITALS — BP 122/70 | HR 84 | Temp 97.2°F | Resp 12 | Wt 133.0 lb

## 2013-04-07 DIAGNOSIS — K6289 Other specified diseases of anus and rectum: Secondary | ICD-10-CM

## 2013-04-07 NOTE — Patient Instructions (Signed)

## 2013-04-07 NOTE — Progress Notes (Signed)
Chief Complaint  Patient presents with  . Rectal Problems    HISTORY: Jo Mitchell is a 37 y.o. female who presents to the office with rectal drainage.  She is s/p an internal drainage of a perirectal abscess.  Other symptoms include occasional pain.  This had been occurring for a couple weeks.  She has tried antibiotics and sitz in the past with only relief of symptoms.  It is intermittent in nature.  Her bowel habits are regular and her bowel movements are soft.  Her fiber intake is dietary.    Past Medical History  Diagnosis Date  . Hyperthyroidism     pt not taking meds at this time.      Past Surgical History  Procedure Laterality Date  . Cesarean section    . Hernia repair    . Incision and drainage perirectal abscess N/A 03/15/2013    Procedure: IRRIGATION AND DEBRIDEMENT PERIRECTAL ABSCESS;  Surgeon: Lodema Pilot, DO;  Location: [facility];  Service: General;  Laterality: N/A;        Current Outpatient Prescriptions  Medication Sig Dispense Refill  . ibuprofen (ADVIL,MOTRIN) 200 MG tablet Take 200 mg by mouth every 6 (six) hours as needed for pain.      . Lactobacillus-Inulin (CULTURELLE DIGESTIVE HEALTH) CAPS Take 1 capsule by mouth at bedtime.      Marland Kitchen oxyCODONE (OXY IR/ROXICODONE) 5 MG immediate release tablet Take 1 tablet (5 mg total) by mouth every 4 (four) hours as needed.  30 tablet  0  . ranitidine (ZANTAC) 150 MG tablet Take 150 mg by mouth 2 (two) times daily as needed for heartburn.       No current facility-administered medications for this visit.      Allergies  Allergen Reactions  . Azithromycin Other (See Comments)    Dizziness, palpitations      No family history on file.  History   Social History  . Marital Status: Married    Spouse Name: N/A    Number of Children: N/A  . Years of Education: N/A   Social History Main Topics  . Smoking status: Never Smoker   . Smokeless tobacco: None  . Alcohol Use: No  . Drug Use: No  . Sexual Activity: None    Other Topics Concern  . None   Social History Narrative  . None      REVIEW OF SYSTEMS - PERTINENT POSITIVES ONLY: Review of Systems - General ROS: negative for - chills, fever or weight loss Hematological and Lymphatic ROS: negative for - bleeding problems, blood clots or bruising Respiratory ROS: no cough, shortness of breath, or wheezing Cardiovascular ROS: no chest pain or dyspnea on exertion Gastrointestinal ROS: no abdominal pain, change in bowel habits, or black or bloody stools Genito-Urinary ROS: no dysuria, trouble voiding, or hematuria  EXAM: Filed Vitals:   04/07/13 0907  BP: 122/70  Pulse: 84  Temp: 97.2 F (36.2 C)  Resp: 12    General appearance: alert and cooperative Resp: clear to auscultation bilaterally Cardio: regular rate and rhythm GI: normal findings: soft, non-tender Perianal: possible ext opening, no drainage, no fluctuant masses, pain to palpation at R anterolateral position.   ASSESSMENT AND PLAN: Jo Mitchell is a 37 y.o. F who is s/p internal drainage of a perirectal abscess.  She is now having recurrent pain.  I suspect she has a recurrent abscess or fistula.  I have recommended EUA with poss I&D, poss fistulotomy or poss seton placement.  Vanita Panda, MD Colon and Rectal Surgery / General Surgery [facility]      Visit Diagnoses: 1. Anal pain     Primary Care Physician:    Vanita Panda, MD Colon and Rectal Surgery / General Surgery [facility]      Visit Diagnoses: 1. Anal pain     Primary Care Physician: Neldon Labella, MD

## 2013-04-07 NOTE — Progress Notes (Addendum)
NPO AFTER MN. ARRIVE AT 0800. NEEDS HG, URINE PREG. AND EKG. WILL TAKE ZANTAC AM DOS W/ SIP OF WATER.

## 2013-04-07 NOTE — Telephone Encounter (Signed)
I called and gave the pt's husband her po appointment for 12/8 at 3:30.  She will call back if needed.

## 2013-04-09 ENCOUNTER — Encounter (HOSPITAL_BASED_OUTPATIENT_CLINIC_OR_DEPARTMENT_OTHER): Payer: BC Managed Care – PPO | Admitting: Anesthesiology

## 2013-04-09 ENCOUNTER — Ambulatory Visit (HOSPITAL_BASED_OUTPATIENT_CLINIC_OR_DEPARTMENT_OTHER)
Admission: RE | Admit: 2013-04-09 | Discharge: 2013-04-09 | Disposition: A | Discharge status: Home / Self Care | Payer: BC Managed Care – PPO | Source: Ambulatory Visit | Attending: General Surgery | Admitting: General Surgery

## 2013-04-09 ENCOUNTER — Encounter (HOSPITAL_BASED_OUTPATIENT_CLINIC_OR_DEPARTMENT_OTHER)
Admission: RE | Disposition: A | Discharge status: Home / Self Care | Payer: Self-pay | Source: Ambulatory Visit | Attending: General Surgery

## 2013-04-09 ENCOUNTER — Encounter (HOSPITAL_BASED_OUTPATIENT_CLINIC_OR_DEPARTMENT_OTHER): Payer: Self-pay | Admitting: *Deleted

## 2013-04-09 ENCOUNTER — Ambulatory Visit (HOSPITAL_BASED_OUTPATIENT_CLINIC_OR_DEPARTMENT_OTHER): Payer: BC Managed Care – PPO | Admitting: Anesthesiology

## 2013-04-09 DIAGNOSIS — Z79899 Other long term (current) drug therapy: Secondary | ICD-10-CM | POA: Insufficient documentation

## 2013-04-09 DIAGNOSIS — K603 Anal fistula, unspecified: Secondary | ICD-10-CM

## 2013-04-09 DIAGNOSIS — K644 Residual hemorrhoidal skin tags: Secondary | ICD-10-CM | POA: Insufficient documentation

## 2013-04-09 DIAGNOSIS — E0590 Thyrotoxicosis, unspecified without thyrotoxic crisis or storm: Secondary | ICD-10-CM | POA: Insufficient documentation

## 2013-04-09 HISTORY — PX: EVALUATION UNDER ANESTHESIA WITH ANAL FISTULECTOMY: SHX5621

## 2013-04-09 HISTORY — DX: Personal history of other endocrine, nutritional and metabolic disease: Z86.39

## 2013-04-09 HISTORY — DX: Palpitations: R00.2

## 2013-04-09 HISTORY — DX: Other specified conditions associated with female genital organs and menstrual cycle: N94.89

## 2013-04-09 HISTORY — DX: Excessive and frequent menstruation with regular cycle: N92.0

## 2013-04-09 HISTORY — DX: Presence of spectacles and contact lenses: Z97.3

## 2013-04-09 HISTORY — DX: Gastro-esophageal reflux disease without esophagitis: K21.9

## 2013-04-09 LAB — POCT HEMOGLOBIN-HEMACUE: Hemoglobin: 13.1 g/dL (ref 12.0–15.0)

## 2013-04-09 LAB — POCT PREGNANCY, URINE: Preg Test, Ur: NEGATIVE

## 2013-04-09 SURGERY — EXAM UNDER ANESTHESIA WITH ANAL FISTULECTOMY
Anesthesia: Monitor Anesthesia Care | Site: Rectum | Wound class: Clean Contaminated

## 2013-04-09 MED ORDER — DOCUSATE SODIUM 100 MG PO CAPS: 100.0000 mg | ORAL_CAPSULE | Freq: Two times a day (BID) | ORAL | Status: DC

## 2013-04-09 MED ORDER — BUPIVACAINE-EPINEPHRINE 0.5% -1:200000 IJ SOLN
INTRAMUSCULAR | Status: DC | PRN
  Administered 2013-04-09: 20 mL

## 2013-04-09 MED ORDER — PROPOFOL 10 MG/ML IV EMUL
INTRAVENOUS | Status: DC | PRN
  Administered 2013-04-09: 100 ug/kg/min via INTRAVENOUS

## 2013-04-09 MED ORDER — LIDOCAINE 5 % EX OINT
TOPICAL_OINTMENT | CUTANEOUS | Status: DC | PRN
  Administered 2013-04-09: 1

## 2013-04-09 MED ORDER — MIDAZOLAM HCL 5 MG/5ML IJ SOLN
INTRAMUSCULAR | Status: DC | PRN
  Administered 2013-04-09: 1 mg via INTRAVENOUS
  Administered 2013-04-09: 2 mg via INTRAVENOUS

## 2013-04-09 MED ORDER — LACTATED RINGERS IV SOLN
INTRAVENOUS | Status: DC
  Administered 2013-04-09 (×2): via INTRAVENOUS
  Filled 2013-04-09: qty 1000

## 2013-04-09 MED ORDER — DEXAMETHASONE SODIUM PHOSPHATE 4 MG/ML IJ SOLN
INTRAMUSCULAR | Status: DC | PRN
  Administered 2013-04-09: 4 mg via INTRAVENOUS

## 2013-04-09 MED ORDER — OXYCODONE HCL 5 MG PO TABS: 5.0000 mg | ORAL_TABLET | Freq: Four times a day (QID) | ORAL | Status: DC | PRN

## 2013-04-09 MED ORDER — FENTANYL CITRATE 0.05 MG/ML IJ SOLN
INTRAMUSCULAR | Status: DC | PRN
  Administered 2013-04-09: 50 ug via INTRAVENOUS
  Administered 2013-04-09 (×2): 25 ug via INTRAVENOUS

## 2013-04-09 MED ORDER — ONDANSETRON HCL 4 MG/2ML IJ SOLN
INTRAMUSCULAR | Status: DC | PRN
  Administered 2013-04-09: 4 mg via INTRAVENOUS

## 2013-04-09 MED ORDER — LIDOCAINE HCL (CARDIAC) 20 MG/ML IV SOLN
INTRAVENOUS | Status: DC | PRN
  Administered 2013-04-09: 50 mg via INTRAVENOUS

## 2013-04-09 SURGICAL SUPPLY — 50 items
APL SKNCLS STERI-STRIP NONHPOA (GAUZE/BANDAGES/DRESSINGS) ×2
BENZOIN TINCTURE PRP APPL 2/3 (GAUZE/BANDAGES/DRESSINGS) ×3 IMPLANT
BLADE HEX COATED 2.75 (ELECTRODE) ×3 IMPLANT
BLADE SURG 10 STRL SS (BLADE) IMPLANT
BLADE SURG 15 STRL LF DISP TIS (BLADE) ×2 IMPLANT
BLADE SURG 15 STRL SS (BLADE) ×3
BRIEF STRETCH FOR OB PAD LRG (UNDERPADS AND DIAPERS) ×6 IMPLANT
CANISTER SUCTION 2500CC (MISCELLANEOUS) ×3 IMPLANT
CLOTH BEACON ORANGE TIMEOUT ST (SAFETY) ×3 IMPLANT
COVER MAYO STAND STRL (DRAPES) ×3 IMPLANT
COVER TABLE BACK 60X90 (DRAPES) ×3 IMPLANT
DECANTER SPIKE VIAL GLASS SM (MISCELLANEOUS) ×3 IMPLANT
DRAPE LG THREE QUARTER DISP (DRAPES) ×6 IMPLANT
DRAPE PED LAPAROTOMY (DRAPES) ×3 IMPLANT
DRAPE UNDERBUTTOCKS STRL (DRAPE) IMPLANT
DRSG PAD ABDOMINAL 8X10 ST (GAUZE/BANDAGES/DRESSINGS) IMPLANT
ELECT BLADE 6.5 .24CM SHAFT (ELECTRODE) IMPLANT
ELECT REM PT RETURN 9FT ADLT (ELECTROSURGICAL) ×3
ELECTRODE REM PT RTRN 9FT ADLT (ELECTROSURGICAL) ×2 IMPLANT
GAUZE SPONGE 4X4 16PLY XRAY LF (GAUZE/BANDAGES/DRESSINGS) IMPLANT
GAUZE VASELINE 3X9 (GAUZE/BANDAGES/DRESSINGS) IMPLANT
GLOVE BIO SURGEON STRL SZ 6 (GLOVE) ×2 IMPLANT
GLOVE BIO SURGEON STRL SZ 6.5 (GLOVE) ×6 IMPLANT
GLOVE INDICATOR 7.0 STRL GRN (GLOVE) ×6 IMPLANT
GOWN PREVENTION PLUS LG XLONG (DISPOSABLE) ×3 IMPLANT
GOWN PREVENTION PLUS XLARGE (GOWN DISPOSABLE) ×3 IMPLANT
GOWN PREVENTION PLUS XXLARGE (GOWN DISPOSABLE) ×3 IMPLANT
GOWN STRL REIN XL XLG (GOWN DISPOSABLE) ×3 IMPLANT
LEGGING LITHOTOMY PAIR STRL (DRAPES) IMPLANT
LOOP VESSEL MAXI BLUE (MISCELLANEOUS) IMPLANT
NDL SAFETY ECLIPSE 18X1.5 (NEEDLE) IMPLANT
NEEDLE HYPO 18GX1.5 SHARP (NEEDLE)
NEEDLE HYPO 25X1 1.5 SAFETY (NEEDLE) ×3 IMPLANT
NS IRRIG 500ML POUR BTL (IV SOLUTION) ×3 IMPLANT
PACK BASIN DAY SURGERY FS (CUSTOM PROCEDURE TRAY) ×3 IMPLANT
PENCIL BUTTON HOLSTER BLD 10FT (ELECTRODE) ×3 IMPLANT
SPONGE GAUZE 4X4 12PLY (GAUZE/BANDAGES/DRESSINGS) ×2 IMPLANT
SPONGE SURGIFOAM ABS GEL 12-7 (HEMOSTASIS) IMPLANT
SUT CHROMIC 2 0 SH (SUTURE) IMPLANT
SUT CHROMIC 3 0 SH 27 (SUTURE) IMPLANT
SUT ETHIBOND 0 (SUTURE) IMPLANT
SUT MON AB 3-0 SH 27 (SUTURE)
SUT MON AB 3-0 SH27 (SUTURE) IMPLANT
SUT VIC AB 4-0 P-3 18XBRD (SUTURE) IMPLANT
SUT VIC AB 4-0 P3 18 (SUTURE)
SYR CONTROL 10ML LL (SYRINGE) ×3 IMPLANT
TOWEL OR 17X24 6PK STRL BLUE (TOWEL DISPOSABLE) ×3 IMPLANT
TRAY DSU PREP LF (CUSTOM PROCEDURE TRAY) ×3 IMPLANT
TUBE CONNECTING 12X1/4 (SUCTIONS) ×3 IMPLANT
YANKAUER SUCT BULB TIP NO VENT (SUCTIONS) ×3 IMPLANT

## 2013-04-09 NOTE — Transfer of Care (Signed)
Immediate Anesthesia Transfer of Care Note  Patient: Jo Mitchell  Procedure(s) Performed: Procedure(s) (LRB): EXAM UNDER ANESTHESIA , FISTULOTOMY (N/A)  Patient Location: PACU  Anesthesia Type: MAC  Level of Consciousness: awake, alert  and oriented  Airway & Oxygen Therapy: Patient Spontanous Breathing and Patient connected to face mask oxygen  Post-op Assessment: Report given to PACU RN and Post -op Vital signs reviewed and stable  Post vital signs: Reviewed and stable  Complications: No apparent anesthesia complications

## 2013-04-09 NOTE — Op Note (Signed)
04/09/2013  10:25 AM  PATIENT:  Jo Mitchell  37 y.o. female  Patient Care Team: Sigmund Hazel, MD as PCP - General (Family Medicine)  PRE-OPERATIVE DIAGNOSIS:  perirectal pain and drainage  POST-OPERATIVE DIAGNOSIS:  Perianal fistula  PROCEDURE: ANAL EXAM UNDER ANESTHESIA , FISTULOTOMY  SURGEON:  Surgeon(s): Romie Levee, MD  ASSISTANT: none   ANESTHESIA:   local and IV sedation  EBL:     DRAINS: none   SPECIMEN:  No Specimen  DISPOSITION OF SPECIMEN:  PATHOLOGY  COUNTS:  YES  PLAN OF CARE: Discharge to home after PACU  PATIENT DISPOSITION:  PACU - hemodynamically stable.  INDICATIONS: this is a 36 year old female who underwent internal drainage of a perirectal abscess. Subsequently she has developed recurrent pain and periodic drainage. She is here today for exam under anesthesia and possible fistulotomy versus seton placement.  OR FINDINGS: Shallow perianal fistula with overlying hemorrhoidal tissue.  DESCRIPTION: the patient was identified in the preoperative folding area and taken to the OR where she was laid prone on the operating room table. MAC anesthesia was smoothly induced. The patient's perianal region was prepped and draped in the usual sterile fashion. A surgical timeout was performed indicating the correct patient, procedure, positioning and preoperative antibiotics. SCDs are also noted in place prior to the initiation of anesthesia. Once this was completed, I began with a digital rectal exam. There are no palpable masses. There was some purulent drainage from the left anterior perianal region. I then performed a rectal block with half percent Marcaine with epinephrine. A placed a Hill-Ferguson anoscope into the anal canal and evaluated the entire perianal region. She was noted to have minimal hemorrhoid disease but did have a small external hemorrhoid at the position of the periodic drainage. There are no fluctuant masses noted. At this point a fistula probe  was inserted into the area of purulent drainage. This came out internally just above the dentate line. This did not appear to involve much muscle at all. I think there was a small amount of internal sphincter over this fistula but it was felt that this would be safe for fistulotomy. This was done using Bovie electrocautery. I then removed the hemorrhoidal tissue over this with sharp dissection. The edges of the fistulotomy tract were then marsupialized using a 3-0 chromic suture. The base of the fistula tract was cauterized. The patient was then awakened from anesthesia and sent to the post anesthesia care unit in stable condition. All counts were correct per operating room staff.

## 2013-04-09 NOTE — H&P (View-Only) (Signed)
Chief Complaint  Patient presents with  . Rectal Problems    HISTORY: Jo Mitchell is a 37 y.o. female who presents to the office with rectal drainage.  She is s/p an internal drainage of a perirectal abscess.  Other symptoms include occasional pain.  This had been occurring for a couple weeks.  She has tried antibiotics and sitz in the past with only relief of symptoms.  It is intermittent in nature.  Her bowel habits are regular and her bowel movements are soft.  Her fiber intake is dietary.    Past Medical History  Diagnosis Date  . Hyperthyroidism     pt not taking meds at this time.      Past Surgical History  Procedure Laterality Date  . Cesarean section    . Hernia repair    . Incision and drainage perirectal abscess N/A 03/15/2013    Procedure: IRRIGATION AND DEBRIDEMENT PERIRECTAL ABSCESS;  Surgeon: Brian Layton, DO;  Location: [facility];  Service: General;  Laterality: N/A;        Current Outpatient Prescriptions  Medication Sig Dispense Refill  . ibuprofen (ADVIL,MOTRIN) 200 MG tablet Take 200 mg by mouth every 6 (six) hours as needed for pain.      . Lactobacillus-Inulin (CULTURELLE DIGESTIVE HEALTH) CAPS Take 1 capsule by mouth at bedtime.      . oxyCODONE (OXY IR/ROXICODONE) 5 MG immediate release tablet Take 1 tablet (5 mg total) by mouth every 4 (four) hours as needed.  30 tablet  0  . ranitidine (ZANTAC) 150 MG tablet Take 150 mg by mouth 2 (two) times daily as needed for heartburn.       No current facility-administered medications for this visit.      Allergies  Allergen Reactions  . Azithromycin Other (See Comments)    Dizziness, palpitations      No family history on file.  History   Social History  . Marital Status: Married    Spouse Name: N/A    Number of Children: N/A  . Years of Education: N/A   Social History Main Topics  . Smoking status: Never Smoker   . Smokeless tobacco: None  . Alcohol Use: No  . Drug Use: No  . Sexual Activity: None    Other Topics Concern  . None   Social History Narrative  . None      REVIEW OF SYSTEMS - PERTINENT POSITIVES ONLY: Review of Systems - General ROS: negative for - chills, fever or weight loss Hematological and Lymphatic ROS: negative for - bleeding problems, blood clots or bruising Respiratory ROS: no cough, shortness of breath, or wheezing Cardiovascular ROS: no chest pain or dyspnea on exertion Gastrointestinal ROS: no abdominal pain, change in bowel habits, or black or bloody stools Genito-Urinary ROS: no dysuria, trouble voiding, or hematuria  EXAM: Filed Vitals:   04/07/13 0907  BP: 122/70  Pulse: 84  Temp: 97.2 F (36.2 C)  Resp: 12    General appearance: alert and cooperative Resp: clear to auscultation bilaterally Cardio: regular rate and rhythm GI: normal findings: soft, non-tender Perianal: possible ext opening, no drainage, no fluctuant masses, pain to palpation at R anterolateral position.   ASSESSMENT AND PLAN: Jo Mitchell is a 37 y.o. F who is s/p internal drainage of a perirectal abscess.  She is now having recurrent pain.  I suspect she has a recurrent abscess or fistula.  I have recommended EUA with poss I&D, poss fistulotomy or poss seton placement.        Jo Guilford C Ivon Oelkers, MD Colon and Rectal Surgery / General Surgery [facility]      Visit Diagnoses: 1. Anal pain     Primary Care Physician:    Jo Harney C Cheron Coryell, MD Colon and Rectal Surgery / General Surgery [facility]      Visit Diagnoses: 1. Anal pain     Primary Care Physician: Jo Mitchell,Jo LYNN, MD   

## 2013-04-09 NOTE — Anesthesia Procedure Notes (Signed)
Procedure Name: MAC Date/Time: 04/09/2013 10:12 AM Performed by: Norva Pavlov Pre-anesthesia Checklist: Patient identified, Patient being monitored, Timeout performed, Emergency Drugs available and Suction available Patient Re-evaluated:Patient Re-evaluated prior to inductionOxygen Delivery Method: Simple face mask Intubation Type: IV induction

## 2013-04-09 NOTE — Anesthesia Preprocedure Evaluation (Signed)
Anesthesia Evaluation  Patient identified by MRN, date of birth, ID band Patient awake    Reviewed: Allergy & Precautions, H&P , NPO status , Patient's Chart, lab work & pertinent test results  Airway Mallampati: II TM Distance: >3 FB Neck ROM: Full    Dental no notable dental hx.    Pulmonary neg pulmonary ROS,  breath sounds clear to auscultation  Pulmonary exam normal       Cardiovascular negative cardio ROS  Rhythm:Regular Rate:Normal     Neuro/Psych negative neurological ROS  negative psych ROS   GI/Hepatic Neg liver ROS, GERD-  Medicated,  Endo/Other    Renal/GU negative Renal ROS  negative genitourinary   Musculoskeletal negative musculoskeletal ROS (+)   Abdominal   Peds negative pediatric ROS (+)  Hematology negative hematology ROS (+)   Anesthesia Other Findings   Reproductive/Obstetrics negative OB ROS                           Anesthesia Physical Anesthesia Plan  ASA: I  Anesthesia Plan: MAC   Post-op Pain Management:    Induction: Intravenous  Airway Management Planned: Simple Face Mask  Additional Equipment:   Intra-op Plan:   Post-operative Plan:   Informed Consent: I have reviewed the patients History and Physical, chart, labs and discussed the procedure including the risks, benefits and alternatives for the proposed anesthesia with the patient or authorized representative who has indicated his/her understanding and acceptance.   Dental advisory given  Plan Discussed with: CRNA and Surgeon  Anesthesia Plan Comments:         Anesthesia Quick Evaluation

## 2013-04-09 NOTE — Interval H&P Note (Signed)
History and Physical Interval Note:  04/09/2013 9:50 AM  Jo Mitchell  has presented today for surgery, with the diagnosis of perirectal pain and drainage  The various methods of treatment have been discussed with the patient and family. After consideration of risks, benefits and other options for treatment, the patient has consented to  Procedure(s): EXAM UNDER ANESTHESIA WITH POSSIBLE FISTULOTOMY, POSSIBLE INCISION AND DRAINAGE,  (N/A) POSSIBLE SETON PLACEMENT (N/A) as a surgical intervention .  The patient's history has been reviewed, patient examined, no change in status, stable for surgery.  I have reviewed the patient's chart and labs.  Questions were answered to the patient's satisfaction.     Vanita Panda, MD  Colorectal and General Surgery [facility]

## 2013-04-09 NOTE — Anesthesia Postprocedure Evaluation (Signed)
  Anesthesia Post-op Note  Patient: Jo Mitchell  Procedure(s) Performed: Procedure(s) (LRB): EXAM UNDER ANESTHESIA , FISTULOTOMY (N/A)  Patient Location: PACU  Anesthesia Type: General  Level of Consciousness: awake and alert   Airway and Oxygen Therapy: Patient Spontanous Breathing  Post-op Pain: mild  Post-op Assessment: Post-op Vital signs reviewed, Patient's Cardiovascular Status Stable, Respiratory Function Stable, Patent Airway and No signs of Nausea or vomiting  Last Vitals:  Filed Vitals:   04/09/13 1115  BP: 105/63  Pulse: 65  Temp:   Resp: 16    Post-op Vital Signs: stable   Complications: No apparent anesthesia complications

## 2013-04-10 ENCOUNTER — Other Ambulatory Visit (INDEPENDENT_AMBULATORY_CARE_PROVIDER_SITE_OTHER): Payer: Self-pay | Admitting: General Surgery

## 2013-04-10 ENCOUNTER — Telehealth (INDEPENDENT_AMBULATORY_CARE_PROVIDER_SITE_OTHER): Payer: Self-pay

## 2013-04-10 DIAGNOSIS — K6289 Other specified diseases of anus and rectum: Secondary | ICD-10-CM

## 2013-04-10 MED ORDER — LIDOCAINE 5 % EX OINT: 1.0000 "application " | TOPICAL_OINTMENT | CUTANEOUS | Status: DC | PRN

## 2013-04-10 NOTE — Telephone Encounter (Signed)
I called and notified the pt that she should have been sent home with a tube of numbing cream but Dr Maisie Fus send an order to her pharmacy.

## 2013-04-10 NOTE — Telephone Encounter (Signed)
The patient's husband called and they were expecting a RX for numbing medication and they want to know how to get it.  I told him it must be prescribed.  I will send a message to Dr Maisie Fus to get an order.

## 2013-04-13 ENCOUNTER — Encounter (HOSPITAL_BASED_OUTPATIENT_CLINIC_OR_DEPARTMENT_OTHER): Payer: Self-pay | Admitting: General Surgery

## 2013-04-14 ENCOUNTER — Telehealth (INDEPENDENT_AMBULATORY_CARE_PROVIDER_SITE_OTHER): Payer: Self-pay

## 2013-04-14 NOTE — Telephone Encounter (Signed)
The pt called in complaining of pain rated a 7 or 8.  She is off pain medicine and taking 1-2 Advil at a time.  The pain goes down to a 3 when she takes Advil.  The pain radiates around to her right buttock.  She has drainage that is red and yellow colored.  She wants to know if this is all normal.  I will speak to Dr Maisie Fus.

## 2013-04-14 NOTE — Telephone Encounter (Signed)
I spoke to Dr Maisie Fus and she states this is all normal for what she did.  I notified the pt.  She will continue what she is doing.  She is doing 3-4 sitz baths a day and I asked her to try the Lidocaine cream still.

## 2013-04-22 ENCOUNTER — Encounter (INDEPENDENT_AMBULATORY_CARE_PROVIDER_SITE_OTHER): Payer: BC Managed Care – PPO | Admitting: General Surgery

## 2013-04-27 ENCOUNTER — Encounter (INDEPENDENT_AMBULATORY_CARE_PROVIDER_SITE_OTHER): Payer: Self-pay | Admitting: General Surgery

## 2013-04-27 ENCOUNTER — Ambulatory Visit (INDEPENDENT_AMBULATORY_CARE_PROVIDER_SITE_OTHER): Payer: BC Managed Care – PPO | Admitting: General Surgery

## 2013-04-27 ENCOUNTER — Encounter (INDEPENDENT_AMBULATORY_CARE_PROVIDER_SITE_OTHER): Payer: Self-pay

## 2013-04-27 VITALS — BP 96/68 | HR 76 | Temp 98.3°F | Resp 14 | Ht 63.75 in | Wt 137.6 lb

## 2013-04-27 DIAGNOSIS — Z9889 Other specified postprocedural states: Secondary | ICD-10-CM

## 2013-04-27 NOTE — Patient Instructions (Signed)
Continue sitz baths until all pain is gone.  Call the office if your symptoms persist after the next 4 weeks.

## 2013-04-27 NOTE — Progress Notes (Signed)
Jo Mitchell is a 37 y.o. female who is status post a fistulotomy and hemorrhoidectomy on 11/20.  She is doing well.  She is off narcotics.  She has a small amount of purulent drainage.    Objective: Filed Vitals:   04/27/13 1533  BP: 96/68  Pulse: 76  Temp: 98.3 F (36.8 C)  Resp: 14    General appearance: alert and cooperative GI: normal findings: soft, non-tender  Incision: healing well, good granulation tissue   Assessment: s/p fistulotomy  There are no active problems to display for this patient.   Plan: Doing well.  RTO PRN    .Vanita Panda, MD [facility], [place] [phone]   04/27/2013 4:01 PM

## 2013-05-29 ENCOUNTER — Encounter (HOSPITAL_COMMUNITY): Payer: Self-pay

## 2013-06-08 ENCOUNTER — Encounter (HOSPITAL_COMMUNITY): Payer: Self-pay

## 2013-06-08 ENCOUNTER — Encounter (HOSPITAL_COMMUNITY)
Admission: RE | Admit: 2013-06-08 | Discharge: 2013-06-08 | Disposition: A | Discharge status: Home / Self Care | Payer: BC Managed Care – PPO | Source: Ambulatory Visit | Attending: Obstetrics and Gynecology | Admitting: Obstetrics and Gynecology

## 2013-06-08 DIAGNOSIS — Z01818 Encounter for other preprocedural examination: Secondary | ICD-10-CM | POA: Insufficient documentation

## 2013-06-08 DIAGNOSIS — Z01812 Encounter for preprocedural laboratory examination: Secondary | ICD-10-CM | POA: Insufficient documentation

## 2013-06-08 HISTORY — DX: Hypothyroidism, unspecified: E03.9

## 2013-06-08 LAB — CBC
HCT: 36.2 % (ref 36.0–46.0)
Hemoglobin: 12.1 g/dL (ref 12.0–15.0)
MCH: 28.5 pg (ref 26.0–34.0)
MCHC: 33.4 g/dL (ref 30.0–36.0)
MCV: 85.2 fL (ref 78.0–100.0)
Platelets: 284 10*3/uL (ref 150–400)
RBC: 4.25 MIL/uL (ref 3.87–5.11)
RDW: 12.3 % (ref 11.5–15.5)
WBC: 4.4 10*3/uL (ref 4.0–10.5)

## 2013-06-08 NOTE — Patient Instructions (Addendum)
   Your procedure is scheduled on:  Friday, Jan 23  [facility]  1130 AM Pick up the phone at the desk and dial [phone] and inform us of your arrival.  Please call this number if you have any problems the morning of surgery: [phone]  Remember: Do not eat food after midnight: Thursday Do not drink clear liquids after: 9 AM Friday, day of surgery Take these medicines the morning of surgery with a SIP OF WATER:  None  Do not wear jewelry, make-up, or FINGER nail polish No metal in your hair or on your body. Do not wear lotions, powders, perfumes.  You may wear deodorant.  Do not bring valuables to the hospital. Contacts, dentures or bridgework may not be worn into surgery.  Patients discharged on the day of surgery will not be allowed to drive home.  Home with husband Salil cell [phone].

## 2013-06-12 ENCOUNTER — Encounter (HOSPITAL_COMMUNITY): Payer: Self-pay | Admitting: Anesthesiology

## 2013-06-12 ENCOUNTER — Ambulatory Visit (HOSPITAL_COMMUNITY): Payer: BC Managed Care – PPO | Admitting: Anesthesiology

## 2013-06-12 ENCOUNTER — Ambulatory Visit (HOSPITAL_COMMUNITY)
Admission: RE | Admit: 2013-06-12 | Discharge: 2013-06-12 | Disposition: A | Discharge status: Home / Self Care | Payer: BC Managed Care – PPO | Source: Ambulatory Visit | Attending: Obstetrics and Gynecology | Admitting: Obstetrics and Gynecology

## 2013-06-12 ENCOUNTER — Encounter (HOSPITAL_COMMUNITY): Payer: BC Managed Care – PPO | Admitting: Anesthesiology

## 2013-06-12 ENCOUNTER — Encounter (HOSPITAL_COMMUNITY)
Admission: RE | Disposition: A | Discharge status: Home / Self Care | Payer: Self-pay | Source: Ambulatory Visit | Attending: Obstetrics and Gynecology

## 2013-06-12 DIAGNOSIS — N920 Excessive and frequent menstruation with regular cycle: Secondary | ICD-10-CM | POA: Insufficient documentation

## 2013-06-12 DIAGNOSIS — Z9889 Other specified postprocedural states: Secondary | ICD-10-CM

## 2013-06-12 DIAGNOSIS — N840 Polyp of corpus uteri: Secondary | ICD-10-CM | POA: Insufficient documentation

## 2013-06-12 DIAGNOSIS — N9489 Other specified conditions associated with female genital organs and menstrual cycle: Secondary | ICD-10-CM

## 2013-06-12 HISTORY — PX: DILATATION & CURRETTAGE/HYSTEROSCOPY WITH RESECTOCOPE: SHX5572

## 2013-06-12 LAB — PREGNANCY, URINE: Preg Test, Ur: NEGATIVE

## 2013-06-12 SURGERY — DILATATION & CURETTAGE/HYSTEROSCOPY WITH RESECTOCOPE
Anesthesia: General | Site: Uterus

## 2013-06-12 MED ORDER — GLYCINE 1.5 % IR SOLN
Status: DC | PRN
  Administered 2013-06-12: 3000 mL

## 2013-06-12 MED ORDER — FENTANYL CITRATE 0.05 MG/ML IJ SOLN
INTRAMUSCULAR | Status: AC
  Administered 2013-06-12: 25 ug via INTRAVENOUS
  Filled 2013-06-12: qty 2

## 2013-06-12 MED ORDER — IBUPROFEN 800 MG PO TABS: 800.0000 mg | ORAL_TABLET | Freq: Three times a day (TID) | ORAL | Status: DC | PRN

## 2013-06-12 MED ORDER — KETOROLAC TROMETHAMINE 30 MG/ML IJ SOLN
INTRAMUSCULAR | Status: AC
  Filled 2013-06-12: qty 2

## 2013-06-12 MED ORDER — CHLOROPROCAINE HCL 1 % IJ SOLN
INTRAMUSCULAR | Status: AC
  Filled 2013-06-12: qty 30

## 2013-06-12 MED ORDER — ONDANSETRON HCL 4 MG/2ML IJ SOLN
INTRAMUSCULAR | Status: DC | PRN
  Administered 2013-06-12: 4 mg via INTRAVENOUS

## 2013-06-12 MED ORDER — ONDANSETRON HCL 4 MG/2ML IJ SOLN
INTRAMUSCULAR | Status: AC
  Filled 2013-06-12: qty 2

## 2013-06-12 MED ORDER — MIDAZOLAM HCL 2 MG/2ML IJ SOLN
INTRAMUSCULAR | Status: AC
  Filled 2013-06-12: qty 2

## 2013-06-12 MED ORDER — LIDOCAINE HCL (CARDIAC) 20 MG/ML IV SOLN
INTRAVENOUS | Status: DC | PRN
  Administered 2013-06-12: 40 mg via INTRAVENOUS

## 2013-06-12 MED ORDER — FENTANYL CITRATE 0.05 MG/ML IJ SOLN
INTRAMUSCULAR | Status: DC | PRN
  Administered 2013-06-12 (×3): 50 ug via INTRAVENOUS

## 2013-06-12 MED ORDER — PROPOFOL 10 MG/ML IV EMUL
INTRAVENOUS | Status: AC
  Filled 2013-06-12: qty 20

## 2013-06-12 MED ORDER — DEXAMETHASONE SODIUM PHOSPHATE 10 MG/ML IJ SOLN
INTRAMUSCULAR | Status: AC
  Filled 2013-06-12: qty 1

## 2013-06-12 MED ORDER — LIDOCAINE HCL (CARDIAC) 20 MG/ML IV SOLN
INTRAVENOUS | Status: AC
  Filled 2013-06-12: qty 5

## 2013-06-12 MED ORDER — FENTANYL CITRATE 0.05 MG/ML IJ SOLN
25.0000 ug | INTRAMUSCULAR | Status: DC | PRN
  Administered 2013-06-12: 25 ug via INTRAVENOUS

## 2013-06-12 MED ORDER — FENTANYL CITRATE 0.05 MG/ML IJ SOLN
INTRAMUSCULAR | Status: AC
  Filled 2013-06-12: qty 5

## 2013-06-12 MED ORDER — LACTATED RINGERS IV SOLN
INTRAVENOUS | Status: DC
  Administered 2013-06-12: 12:00:00 via INTRAVENOUS

## 2013-06-12 MED ORDER — DEXAMETHASONE SODIUM PHOSPHATE 4 MG/ML IJ SOLN
INTRAMUSCULAR | Status: DC | PRN
  Administered 2013-06-12: 6 mg via INTRAVENOUS

## 2013-06-12 MED ORDER — KETOROLAC TROMETHAMINE 30 MG/ML IJ SOLN
INTRAMUSCULAR | Status: DC | PRN
  Administered 2013-06-12: 30 mg via INTRAMUSCULAR
  Administered 2013-06-12: 30 mg via INTRAVENOUS

## 2013-06-12 MED ORDER — MIDAZOLAM HCL 2 MG/2ML IJ SOLN
INTRAMUSCULAR | Status: DC | PRN
  Administered 2013-06-12: 2 mg via INTRAVENOUS

## 2013-06-12 MED ORDER — PROPOFOL INFUSION 10 MG/ML OPTIME
INTRAVENOUS | Status: DC | PRN
  Administered 2013-06-12: 200 mL via INTRAVENOUS

## 2013-06-12 MED ORDER — PROMETHAZINE HCL 25 MG/ML IJ SOLN: 6.2500 mg | INTRAMUSCULAR | Status: DC | PRN

## 2013-06-12 SURGICAL SUPPLY — 16 items
CANISTER SUCT 3000ML (MISCELLANEOUS) ×3 IMPLANT
CATH ROBINSON RED A/P 16FR (CATHETERS) ×3 IMPLANT
CLOTH BEACON ORANGE TIMEOUT ST (SAFETY) ×3 IMPLANT
CONTAINER PREFILL 10% NBF 60ML (FORM) ×3 IMPLANT
DRSG TELFA 3X8 NADH (GAUZE/BANDAGES/DRESSINGS) ×3 IMPLANT
ELECT REM PT RETURN 9FT ADLT (ELECTROSURGICAL) ×3
ELECTRODE REM PT RTRN 9FT ADLT (ELECTROSURGICAL) ×1 IMPLANT
GLOVE BIOGEL PI IND STRL 7.0 (GLOVE) ×2 IMPLANT
GLOVE BIOGEL PI INDICATOR 7.0 (GLOVE) ×4
GLOVE ECLIPSE 6.5 STRL STRAW (GLOVE) ×3 IMPLANT
GOWN STRL REIN XL XLG (GOWN DISPOSABLE) ×6 IMPLANT
LOOP ANGLED CUTTING 22FR (CUTTING LOOP) ×3 IMPLANT
PACK HYSTEROSCOPY LF (CUSTOM PROCEDURE TRAY) ×3 IMPLANT
PAD OB MATERNITY 4.3X12.25 (PERSONAL CARE ITEMS) ×3 IMPLANT
TOWEL OR 17X24 6PK STRL BLUE (TOWEL DISPOSABLE) ×6 IMPLANT
WATER STERILE IRR 1000ML POUR (IV SOLUTION) ×3 IMPLANT

## 2013-06-12 NOTE — Transfer of Care (Signed)
Immediate Anesthesia Transfer of Care Note  Patient: Jo ChiquitoYogeeta S Mitchell  Procedure(s) Performed: Procedure(s): DILATATION & CURETTAGE/HYSTEROSCOPY WITH RESECTOCOPE (N/A)  Patient Location: PACU  Anesthesia Type:General  Level of Consciousness: awake, alert  and oriented  Airway & Oxygen Therapy: Patient Spontanous Breathing and Patient connected to nasal cannula oxygen  Post-op Assessment: Report given to PACU RN and Post -op Vital signs reviewed and stable  Post vital signs: Reviewed and stable  Complications: No apparent anesthesia complications

## 2013-06-12 NOTE — Discharge Instructions (Signed)
CALL  IF TEMP>100.4, NOTHING PER VAGINA X 1 WK, CALL IF SOAKING A MAXI  PAD EVERY HOUR OR MORE FREQUENTLY  No ibuprofen containing products (ie Advil, Aleve, Motrin, etc.) until after 7:30 pm tonight  DISCHARGE INSTRUCTIONS: D&C / D&E The following instructions have been prepared to help you care for yourself upon your return home.   Personal hygiene:  Use sanitary pads for vaginal drainage, not tampons.  Shower the day after your procedure.  NO tub baths, pools or Jacuzzis for 2-3 weeks.  Wipe front to back after using the bathroom.  Activity and limitations:  Do NOT drive or operate any equipment for 24 hours. The effects of anesthesia are still present and drowsiness may result.  Do NOT rest in bed all day.  Walking is encouraged.  Walk up and down stairs slowly.  You may resume your normal activity in one to two days or as indicated by your physician.  Sexual activity: NO intercourse for at least 2 weeks after the procedure, or as indicated by your physician.  Diet: Eat a light meal as desired this evening. You may resume your usual diet tomorrow.  Return to work: You may resume your work activities in one to two days or as indicated by your doctor.  What to expect after your surgery: Expect to have vaginal bleeding/discharge for 2-3 days and spotting for up to 10 days. It is not unusual to have soreness for up to 1-2 weeks. You may have a slight burning sensation when you urinate for the first day. Mild cramps may continue for a couple of days. You may have a regular period in 2-6 weeks.  Call your doctor for any of the following:  Excessive vaginal bleeding, saturating and changing one pad every hour.  Inability to urinate 6 hours after discharge from hospital.  Pain not relieved by pain medication.  Fever of 100.4 F or greater.  Unusual vaginal discharge or odor.   Call for an appointment:    Patients signature: ______________________  Nurses signature  ________________________  Support person's signature_______________________

## 2013-06-12 NOTE — Brief Op Note (Signed)
06/12/2013  2:18 PM  PATIENT:  Jo Mitchell  38 y.o. female  PRE-OPERATIVE DIAGNOSIS:  Menorrhagia, Endometrial Mass    POST-OPERATIVE DIAGNOSIS:  Menorrhagia, Endometrial Mass  PROCEDURE:  Diagnostic hysteroscopy, Dilation and curettage, hysteroscopic resection of endometrial polyp  SURGEON:  Surgeon(s) and Role:    * Taheerah Guldin Cathie BeamsA Dyami Umbach, MD - Primary  PHYSICIAN ASSISTANT:   ASSISTANTS: none   ANESTHESIA:   general  EBL:  Total I/O In: 700 [I.V.:700] Out: 100 [Urine:100]  BLOOD ADMINISTERED:none  DRAINS: none   LOCAL MEDICATIONS USED:  NONE  SPECIMEN:  Source of Specimen:  emc w/ polyp  DISPOSITION OF SPECIMEN:  PATHOLOGY  COUNTS:  YES  TOURNIQUET:  * No tourniquets in log *  DICTATION: .Other Dictation: Dictation Number [id number]  PLAN OF CARE: Discharge to home after PACU  PATIENT DISPOSITION:  PACU - hemodynamically stable.   Delay start of Pharmacological VTE agent (>24hrs) due to surgical blood loss or risk of bleeding: no

## 2013-06-12 NOTE — H&P (Signed)
Jo Mitchell is an 38 y.o. female. G1P1 married asian female presents for surgical mgmt of 1.2 cm polypoid mass noted on sonohysterogram done for endometrial thickening noted on sonogram which had been done for heavy cycles  Pertinent Gynecological History: Menses: flow is excessive with use of overnight pads or tampons on heaviest days Bleeding: heavy2 Contraception: condoms DES exposure: denies Blood transfusions: none Sexually transmitted diseases: no past history Previous GYN Procedures: c/s  Last mammogram: n/a Date:  Last pap: normal Date: 2014 OB History: G1, P1   Menstrual History: Menarche age: n/a  No LMP recorded.    Past Medical History  Diagnosis Date  . Heart palpitations     hx  cefeene related, cut back on caf. - no current problems  . History of hypothyroidism     postpartum 2005  -- resolved after 6 months w/ meds. resolved  . Wears glasses   . Menorrhagia   . Endometrial mass   . Hyperthyroidism     hx - resolved 05/2013 per MD  . Hypothyroidism     postpartum 2005  -- resolved after 6 months w/ meds. resolved  . Mild acid reflux     occasional - med prn    Past Surgical History  Procedure Laterality Date  . Cesarean section  11-25-2003  . Incision and drainage perirectal abscess N/A 03/15/2013    Procedure: IRRIGATION AND DEBRIDEMENT PERIRECTAL ABSCESS;  Surgeon: Lodema PilotBrian Layton, DO;  Location: [facility];  Service: General;  Laterality: N/A;  . Inguinal hernia repair Left 1999  . Evaluation under anesthesia with anal fistulectomy N/A 04/09/2013    Procedure: EXAM UNDER ANESTHESIA , FISTULOTOMY;  Surgeon: Romie LeveeAlicia Thomas, MD;  [facility] [place];  Service: General;  Laterality: N/A;    No family history on file.  Social History:  reports that she has never smoked. She has never used smokeless tobacco. She reports that she does not drink alcohol or use illicit drugs.  Allergies:  Allergies  Allergen Reactions  . Amoxicillin Diarrhea  and Other (See Comments)    Dizziness/ palpitations  . Azithromycin Other (See Comments)    Dizziness, palpitations    No prescriptions prior to admission    Review of Systems  All other systems reviewed and are negative.    There were no vitals taken for this visit. Physical Exam  Constitutional: She is oriented to person, place, and time. She appears well-developed and well-nourished.  HENT:  Head: Atraumatic.  Neck: Neck supple.  GI: Soft.  Genitourinary: Vagina normal and uterus normal.  Neurological: She is alert and oriented to person, place, and time.  Skin: Skin is warm and dry.  Psychiatric: She has a normal mood and affect.    No results found for this or any previous visit (from the past 24 hour(s)).  No results found.  Assessment/Plan: Endometrial mass Menorrhagia P) dx hysteroscopy, D&C, resection of endometrial mass. Procedure reviewed. Risk disc including but not limited to infection, bleeding, fluid overload and its mgmt., thermal injury , injury to surrounding organ structures, uterine perforation  And its mgmt/consequences. All ? answered  Stephanie Mcglone A 06/12/2013, 7:17 AM

## 2013-06-12 NOTE — Anesthesia Postprocedure Evaluation (Signed)
  Anesthesia Post-op Note  Patient: Jo Mitchell  Procedure(s) Performed: Procedure(s): DILATATION & CURETTAGE/HYSTEROSCOPY WITH RESECTOCOPE (N/A)  Patient Location: PACU  Anesthesia Type:General  Level of Consciousness: awake, alert  and oriented  Airway and Oxygen Therapy: Patient Spontanous Breathing  Post-op Pain: mild  Post-op Assessment: Post-op Vital signs reviewed, Patient's Cardiovascular Status Stable, Respiratory Function Stable, Patent Airway, No signs of Nausea or vomiting and Pain level controlled  Post-op Vital Signs: Reviewed and stable  Complications: No apparent anesthesia complications

## 2013-06-12 NOTE — Anesthesia Preprocedure Evaluation (Signed)
Anesthesia Evaluation  Patient identified by MRN, date of birth, ID band Patient awake    Reviewed: Allergy & Precautions, H&P , Patient's Chart, lab work & pertinent test results, reviewed documented beta blocker date and time   Airway Mallampati: II  TM Distance: >3 FB Neck ROM: full    Dental no notable dental hx.    Pulmonary  breath sounds clear to auscultation  Pulmonary exam normal       Cardiovascular Rhythm:regular Rate:Normal     Neuro/Psych    GI/Hepatic GERD-  Medicated,  Endo/Other    Renal/GU      Musculoskeletal   Abdominal   Peds  Hematology   Anesthesia Other Findings   Reproductive/Obstetrics                             Anesthesia Physical Anesthesia Plan  ASA: II  Anesthesia Plan:    Post-op Pain Management:    Induction: Intravenous  Airway Management Planned: LMA  Additional Equipment:   Intra-op Plan:   Post-operative Plan:   Informed Consent: I have reviewed the patients History and Physical, chart, labs and discussed the procedure including the risks, benefits and alternatives for the proposed anesthesia with the patient or authorized representative who has indicated his/her understanding and acceptance.   Dental Advisory Given and Dental advisory given  Plan Discussed with: CRNA and Surgeon  Anesthesia Plan Comments: (Discussed GA with LMA, possible sore throat, potential need to switch to ETT, N/V, pulmonary aspiration. Questions answered. )        Anesthesia Quick Evaluation  

## 2013-06-13 NOTE — Op Note (Signed)
NAMLexine Baton:  Mikhail, Leata               ACCOUNT NO.:  [id number]  MEDICAL RECORD NO.:  [id number]  LOCATION:  [facility]                          FACILITY:  [facility]  PHYSICIAN:  Maxie BetterSheronette Zhaire Locker, M.D.DATE OF BIRTH:  1976/04/21  DATE OF PROCEDURE:  06/12/2013 DATE OF DISCHARGE:  06/12/2013                              OPERATIVE REPORT   PREOPERATIVE DIAGNOSES:  Menorrhagia, endometrial mass.  POSTOPERATIVE DIAGNOSES:  Menorrhagia, endometrial polyp.  PROCEDURE:  Diagnostic hysteroscopy, hysteroscopic resection of endometrial polyp, dilation and curettage.  ANESTHESIA:  General.  SURGEON:  Maxie BetterSheronette Edrees Valent, M.D.  ASSISTANT:  None.  DESCRIPTION OF PROCEDURE:  Under adequate general anesthesia, the patient was placed in a dorsal lithotomy position.  She was sterilely prepped and draped in usual fashion.  The bladder was catheterized for moderate amount of urine.  Examination under anesthesia revealed anteverted uterus.  No adnexal masses could be appreciated.  Bivalve speculum was placed in the vagina.  Single-tooth tenaculum was placed in the anterior lip of the cervix.  The cervical canal was then dilated up to #[facility] dilator.  A resectoscope with single loop was inserted with glycine fluid.  There was a mass emanating from the left cornual region. The right cornua could be seen now.  The left was not seen with respect to the tubes.  No other lesion was noted.  Using the single loop resectoscope, the mass in the cornual region was carefully resected. The pieces were removed.  The cavity was then gently curetted. Resectoscope was inserted, no lesions noted, at which time all instruments were removed from the vagina.  SPECIMEN:  Endometrial curetting with polyps sent to Pathology.  ESTIMATED BLOOD LOSS:  Less than 30 mL.  INTRAOPERATIVE FLUID:  700 mL.  URINE OUTPUT:  100 mL.  FLUID DEFICIT:  80 mL.  COUNTS:  Sponge and instrument counts x2 was correct.  COMPLICATION:   None.  The patient tolerated the procedure well, was transferred to recovery in stable condition.     Maxie BetterSheronette Cylan Borum, M.D.     [place]/MEDQ  D:  06/12/2013  T:  06/13/2013  Job:  [id number]

## 2013-06-15 ENCOUNTER — Encounter (HOSPITAL_COMMUNITY): Payer: Self-pay | Admitting: Obstetrics and Gynecology

## 2013-09-17 ENCOUNTER — Encounter: Payer: Self-pay | Admitting: *Deleted

## 2013-09-18 ENCOUNTER — Encounter: Payer: Self-pay | Admitting: Neurology

## 2013-09-18 ENCOUNTER — Encounter (INDEPENDENT_AMBULATORY_CARE_PROVIDER_SITE_OTHER): Payer: Self-pay

## 2013-09-18 ENCOUNTER — Ambulatory Visit (INDEPENDENT_AMBULATORY_CARE_PROVIDER_SITE_OTHER): Payer: BC Managed Care – PPO | Admitting: Neurology

## 2013-09-18 VITALS — BP 93/59 | HR 68 | Ht 62.0 in | Wt 137.0 lb

## 2013-09-18 DIAGNOSIS — G253 Myoclonus: Secondary | ICD-10-CM

## 2013-09-18 NOTE — Progress Notes (Signed)
[facility]    Provider:  Dr Hosie PoissonSumner Referring Provider: Sigmund HazelMiller, Lisa, MD Primary Care Physician:  Neldon LabellaMILLER,LISA LYNN, MD  CC:  myoclonus  HPI:  [name] is a 38 y.o. female here as a referral from Dr. Hyacinth MeekerMiller for myoclonus evaluation. Initially noted symptoms around 2 months ago. Initially noted a brief generalized jerk before sleeping, husband noted frequent jerking episodes while she was sleeping. Progressed to occuring during the day time too. While at rest will have jerking episodes, can be either legs, arms or whole body. Frequency varies day to day, on bad days will occur every 10 minutes, will be brief 2-3 jerks and then it resolves. Prior to events occuring will occasionally get a strange feeling in her spine. She is not able to suppress the symptoms. Has not dropped or knocked anything over. Will feel unsteady if she is standing and has episodes. Events only occur when resting. Do not get episodes when active or moving. Patient and husband note episodes of confusion and staring off. In March, started taking Etodolac for 4 days but developed severe side effects so stopped. No other new or changed medications. No recent fevers or illnesses, no recent weight gain or loss.   Overall healthy. No history of trauma to head or neck. No history of seizure  Review of Systems: Out of a complete 14 system review, the patient complains of only the following symptoms, and all other reviewed systems are negative. + fatigue, lymph nodes, confusion, headache, dizziness, tremor, depression, anxiety, allergies  History   Social History  . Marital Status: Married    Spouse Name: N/A    Number of Children: 1  . Years of Education: 12+   Occupational History  . Student     Social History Main Topics  . Smoking status: Never Smoker   . Smokeless tobacco: Never Used  . Alcohol Use: No  . Drug Use: No  . Sexual Activity: Yes    Birth Control/ Protection: Condom   Other  Topics Concern  . Not on file   Social History Narrative   Patient lives at home with husband. 1 child   Student obtaining PhD   Patient is right handed.    Patient does not drink any caffeine.          History reviewed. No pertinent family history.  Past Medical History  Diagnosis Date  . Heart palpitations     hx  cefeene related, cut back on caf. - no current problems  . History of hypothyroidism     postpartum 2005  -- resolved after 6 months w/ meds. resolved  . Wears glasses   . Menorrhagia   . Endometrial mass   . Mild acid reflux     occasional - med prn  . Vitamin D deficiency   . HA (headache)     Past Surgical History  Procedure Laterality Date  . Cesarean section  11-25-2003  . Incision and drainage perirectal abscess N/A 03/15/2013    Procedure: IRRIGATION AND DEBRIDEMENT PERIRECTAL ABSCESS;  Surgeon: Lodema PilotBrian Layton, DO;  Location: [facility];  Service: General;  Laterality: N/A;  . Inguinal hernia repair Left 1999  . Evaluation under anesthesia with anal fistulectomy N/A 04/09/2013    Procedure: EXAM UNDER ANESTHESIA , FISTULOTOMY;  Surgeon: Romie LeveeAlicia Thomas, MD;  [facility] [place];  Service: General;  Laterality: N/A;  . Dilatation & currettage/hysteroscopy with resectocope N/A 06/12/2013    Procedure: DILATATION & CURETTAGE/HYSTEROSCOPY WITH RESECTOCOPE;  Surgeon:  Serita KyleSheronette A Cousins, MD;  Location: [facility];  Service: Gynecology;  Laterality: N/A;    Current Outpatient Prescriptions  Medication Sig Dispense Refill  . ibuprofen (ADVIL,MOTRIN) 800 MG tablet Take 1 tablet (800 mg total) by mouth every 8 (eight) hours as needed.  30 tablet  1  . Probiotic Product (PROBIOTIC DAILY PO) Take by mouth.      . ranitidine (ZANTAC) 150 MG tablet Take 150 mg by mouth 2 (two) times daily as needed for heartburn.       No current facility-administered medications for this visit.    Allergies as of 09/18/2013 - Review Complete 09/18/2013  Allergen Reaction  Noted  . Amoxicillin Diarrhea and Other (See Comments) 04/07/2013  . Azithromycin Other (See Comments) 03/11/2013  . Dexilant [dexlansoprazole] Other (See Comments) 09/17/2013  . Lodine [etodolac] Palpitations 09/17/2013    Vitals: BP 93/59  Pulse 68  Ht 5\' 2"  (1.575 m)  Wt 137 lb (62.143 kg)  BMI 25.05 kg/m2 Last Weight:  Wt Readings from Last 1 Encounters:  09/18/13 137 lb (62.143 kg)   Last Height:   Ht Readings from Last 1 Encounters:  09/18/13 5\' 2"  (1.575 m)     Physical exam: Exam: Gen: NAD, conversant Eyes: anicteric sclerae, moist conjunctivae HENT: Atraumatic, oropharynx clear Neck: Trachea midline; supple,  Lungs: CTA, no wheezing, rales, rhonic                          CV: RRR, no MRG Abdomen: Soft, non-tender;  Extremities: No peripheral edema  Skin: Normal temperature, no rash,  Psych: Appropriate affect, pleasant  Neuro: MS: AA&Ox3, appropriately interactive, normal affect   Speech: fluent w/o paraphasic error  Memory: good recent and remote recall  CN: PERRL, EOMI no nystagmus, no ptosis, sensation intact to LT V1-V3 bilat, face symmetric, no weakness, hearing grossly intact, palate elevates symmetrically, shoulder shrug 5/5 bilat,  tongue protrudes midline, no fasiculations noted.  Motor: normal bulk and tone Strength: 5/5  In all extremities No abnormal involuntary movements or myoclonic jerks noted  Coord: rapid alternating and point-to-point (FNF, HTS) movements intact.  Reflexes: brisk reflexes with spread,  bilat downgoing toes  Sens: LT intact in all extremities  Gait: posture, stance, stride and arm-swing normal. Tandem gait intact. Able to walk on heels and toes. Romberg absent.  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 Unclear etiology of symptoms, with exam showing brisk reflexes with spread would have concern over a central process. Will check MRI brain and cervical  spine, will check lab workup. If unremarkable can consider checking EEG and/or EMG in the future. Hold off on medication treatment at this time. Can consider klonopin or keppra in the future. Follow up once workup completed.   Assessment:  After physical and neurologic examination, review of laboratory studies, imaging, neurophysiology testing and pre-existing records, assessment will be reviewed on the problem list.  Plan:  Treatment plan and additional workup will be reviewed under Problem List.    Elspeth ChoPeter Aryan Sparks, DO  [facility] [address] [place], [place] [address]  Phone [phone] Fax [phone]

## 2013-09-18 NOTE — Patient Instructions (Signed)
Overall you are doing fairly well but I do want to suggest a few things today:   Remember to drink plenty of fluid, eat healthy meals and do not skip any meals. Try to eat protein with a every meal and eat a healthy snack such as fruit or nuts in between meals. Try to keep a regular sleep-wake schedule and try to exercise daily, particularly in the form of walking, 20-30 minutes a day, if you can.   As far as diagnostic testing:  1)Please have some blood work completed today 2)I would like you to have a MRI of your brain and cervical spine. You will be called to schedule this  I would like to see you back after the MRI is completed, sooner if we need to. Please call us with any interim questions, concerns, problems, updates or refill requests.   My clinical assistant and will answer any of your questions and relay your messages to me and also relay most of my messages to you.   Our phone number is [phone]. We also have an after hours call service for urgent matters and there is a physician on-call for urgent questions. For any emergencies you know to call 911 or go to the nearest emergency room

## 2013-09-19 LAB — TSH: TSH: 1.53 u[IU]/mL (ref 0.450–4.500)

## 2013-09-19 LAB — COMPREHENSIVE METABOLIC PANEL
ALK PHOS: 41 IU/L (ref 39–117)
ALT: 11 IU/L (ref 0–32)
AST: 15 IU/L (ref 0–40)
Albumin/Globulin Ratio: 1.9 (ref 1.1–2.5)
Albumin: 4.4 g/dL (ref 3.5–5.5)
BILIRUBIN TOTAL: 0.6 mg/dL (ref 0.0–1.2)
BUN / CREAT RATIO: 11 (ref 8–20)
BUN: 8 mg/dL (ref 6–20)
CO2: 25 mmol/L (ref 18–29)
CREATININE: 0.71 mg/dL (ref 0.57–1.00)
Calcium: 8.7 mg/dL (ref 8.7–10.2)
Chloride: 104 mmol/L (ref 97–108)
GFR calc Af Amer: 125 mL/min/{1.73_m2} (ref >59)
GFR, EST NON AFRICAN AMERICAN: 108 mL/min/{1.73_m2} (ref >59)
GLOBULIN, TOTAL: 2.3 g/dL (ref 1.5–4.5)
Glucose: 77 mg/dL (ref 65–99)
POTASSIUM: 4.7 mmol/L (ref 3.5–5.2)
Sodium: 142 mmol/L (ref 134–144)
Total Protein: 6.7 g/dL (ref 6.0–8.5)

## 2013-09-19 LAB — ANA W/REFLEX IF POSITIVE: Anti Nuclear Antibody(ANA): NEGATIVE

## 2013-09-19 LAB — AMMONIA: Ammonia: 41 ug/dL (ref 19–87)

## 2013-09-21 NOTE — Progress Notes (Signed)
Quick Note:  Called to share normal labs per Dr Minus BreedingSumner's results. She verbalized understanding ______

## 2013-09-24 ENCOUNTER — Ambulatory Visit (INDEPENDENT_AMBULATORY_CARE_PROVIDER_SITE_OTHER): Payer: BC Managed Care – PPO

## 2013-09-24 DIAGNOSIS — G253 Myoclonus: Secondary | ICD-10-CM

## 2013-09-28 ENCOUNTER — Other Ambulatory Visit: Payer: Self-pay | Admitting: Neurology

## 2013-09-28 DIAGNOSIS — G253 Myoclonus: Secondary | ICD-10-CM

## 2013-09-28 NOTE — Progress Notes (Signed)
Quick Note:  Left message of normal MRI, also an EMG/NCS test has been ordered patient will be contacted with appointment information. ______

## 2013-10-23 ENCOUNTER — Encounter (INDEPENDENT_AMBULATORY_CARE_PROVIDER_SITE_OTHER): Payer: Self-pay

## 2013-10-23 ENCOUNTER — Ambulatory Visit (INDEPENDENT_AMBULATORY_CARE_PROVIDER_SITE_OTHER): Payer: BC Managed Care – PPO | Admitting: Neurology

## 2013-10-23 DIAGNOSIS — M79609 Pain in unspecified limb: Secondary | ICD-10-CM

## 2013-10-23 DIAGNOSIS — G253 Myoclonus: Secondary | ICD-10-CM

## 2013-10-23 DIAGNOSIS — Z0289 Encounter for other administrative examinations: Secondary | ICD-10-CM

## 2013-10-23 NOTE — Procedures (Signed)
     HISTORY:  Jo Mitchell is a 38 year old patient with a history of neck and low back pain for several years with discomfort going down the left leg and the arms, right greater than left. The patient is being evaluated for this issue. She also experiences myoclonus affecting the arms and legs.  NERVE CONDUCTION STUDIES:  Nerve conduction studies were performed on the right upper extremity. The distal motor latencies and motor amplitudes for the median and ulnar nerves were within normal limits. The F wave latencies and nerve conduction velocities for these nerves were also normal. The sensory latencies for the median and ulnar nerves were normal.  Nerve conduction studies were performed on the left lower extremity. The distal motor latencies and motor amplitudes for the peroneal and posterior tibial nerves were within normal limits. The nerve conduction velocities for these nerves were also normal. The H reflex latency was normal. The sensory latency for the peroneal nerve was within normal limits.   EMG STUDIES:  EMG study was performed on the left lower extremity:  The tibialis anterior muscle reveals 2 to 4K motor units with full recruitment. No fibrillations or positive waves were seen. The peroneus tertius muscle reveals 2 to 4K motor units with full recruitment. No fibrillations or positive waves were seen. The medial gastrocnemius muscle reveals 1 to 3K motor units with full recruitment. No fibrillations or positive waves were seen. The vastus lateralis muscle reveals 2 to 4K motor units with full recruitment. No fibrillations or positive waves were seen. The iliopsoas muscle reveals 2 to 4K motor units with full recruitment. No fibrillations or positive waves were seen. The biceps femoris muscle (long head) reveals 2 to 4K motor units with full recruitment. No fibrillations or positive waves were seen. The lumbosacral paraspinal muscles were tested at 3 levels, and revealed no  abnormalities of insertional activity at all 3 levels tested. There was good relaxation.  The patient refused EMG evaluation of the right arm.  IMPRESSION:  Nerve conduction studies done on the right upper extremity and left lower extremity were within normal limits. No evidence of a peripheral neuropathy is seen. EMG evaluation of the left lower extremity was unremarkable, without evidence of an overlying lumbosacral radiculopathy. The patient refused EMG evaluation of the right upper extremity.  Marlan Palau MD 10/23/2013 11:11 AM  [facility] [address] [place], [place] [address]  Phone [phone] Fax [phone]

## 2013-10-27 NOTE — Progress Notes (Signed)
Quick Note:  Spoke with patient, she is scheduled for 10/28/13 at 2:30 pm. ______

## 2013-10-28 ENCOUNTER — Encounter: Payer: Self-pay | Admitting: Neurology

## 2013-10-28 ENCOUNTER — Ambulatory Visit (INDEPENDENT_AMBULATORY_CARE_PROVIDER_SITE_OTHER): Payer: BC Managed Care – PPO | Admitting: Neurology

## 2013-10-28 VITALS — BP 104/65 | HR 72 | Ht 64.0 in | Wt 138.0 lb

## 2013-10-28 DIAGNOSIS — G253 Myoclonus: Secondary | ICD-10-CM

## 2013-10-28 NOTE — Progress Notes (Signed)
[facility]    Provider:  Dr Hosie PoissonSumner Referring Provider: Sigmund HazelMiller, Lisa, MD Primary Care Physician:  Neldon LabellaMILLER,LISA LYNN, MD  CC:  myoclonus  HPI:  Jo Mitchell is a 38 y.o. female here as a follow up from Dr. Hyacinth MeekerMiller for myoclonus evaluation. Since last viist has had normal lab work, MRI brain and C spine and EMG/NCS. They note her sympotms have calmed down slightly and are now mainly at night and triggered by stress/anxiety.   Initial visit 09/2013: Initially noted symptoms around 2 months ago. Initially noted a brief generalized jerk before sleeping, husband noted frequent jerking episodes while she was sleeping. Progressed to occuring during the day time too. While at rest will have jerking episodes, can be either legs, arms or whole body. Frequency varies day to day, on bad days will occur every 10 minutes, will be brief 2-3 jerks and then it resolves. Prior to events occuring will occasionally get a strange feeling in her spine. She is not able to suppress the symptoms. Has not dropped or knocked anything over. Will feel unsteady if she is standing and has episodes. Events only occur when resting. Do not get episodes when active or moving. Patient and husband note episodes of confusion and staring off. In March, started taking Etodolac for 4 days but developed severe side effects so stopped. No other new or changed medications. No recent fevers or illnesses, no recent weight gain or loss.   Overall healthy. No history of trauma to head or neck. No history of seizure;  Review of Systems: Out of a complete 14 system review, the patient complains of only the following symptoms, and all other reviewed systems are negative. + fatigue, lymph nodes, confusion, headache, dizziness, tremor, depression, anxiety, allergies  History   Social History  . Marital Status: Married    Spouse Name: N/A    Number of Children: 1  . Years of Education: 12+   Occupational History  .  Student     Social History Main Topics  . Smoking status: Never Smoker   . Smokeless tobacco: Never Used  . Alcohol Use: No  . Drug Use: No  . Sexual Activity: Yes    Birth Control/ Protection: Condom   Other Topics Concern  . Not on file   Social History Narrative   Patient lives at home with husband. 1 child   Student obtaining PhD   Patient is right handed.    Patient does not drink any caffeine.          No family history on file.  Past Medical History  Diagnosis Date  . Heart palpitations     hx  cefeene related, cut back on caf. - no current problems  . History of hypothyroidism     postpartum 2005  -- resolved after 6 months w/ meds. resolved  . Wears glasses   . Menorrhagia   . Endometrial mass   . Mild acid reflux     occasional - med prn  . Vitamin D deficiency   . HA (headache)     Past Surgical History  Procedure Laterality Date  . Cesarean section  11-25-2003  . Incision and drainage perirectal abscess N/A 03/15/2013    Procedure: IRRIGATION AND DEBRIDEMENT PERIRECTAL ABSCESS;  Surgeon: Lodema PilotBrian Layton, DO;  Location: [facility];  Service: General;  Laterality: N/A;  . Inguinal hernia repair Left 1999  . Evaluation under anesthesia with anal fistulectomy N/A 04/09/2013    Procedure: EXAM UNDER ANESTHESIA ,  FISTULOTOMY;  Surgeon: Romie Levee, MD;  Location: [facility];  Service: General;  Laterality: N/A;  . Dilatation & currettage/hysteroscopy with resectocope N/A 06/12/2013    Procedure: DILATATION & CURETTAGE/HYSTEROSCOPY WITH RESECTOCOPE;  Surgeon: Serita Kyle, MD;  Location: [facility];  Service: Gynecology;  Laterality: N/A;    Current Outpatient Prescriptions  Medication Sig Dispense Refill  . ibuprofen (ADVIL,MOTRIN) 800 MG tablet Take 1 tablet (800 mg total) by mouth every 8 (eight) hours as needed.  30 tablet  1  . Probiotic Product (PROBIOTIC DAILY PO) Take by mouth.      . ranitidine (ZANTAC) 150 MG tablet Take 150 mg by  mouth 2 (two) times daily as needed for heartburn.       No current facility-administered medications for this visit.    Allergies as of 10/28/2013 - Review Complete 10/28/2013  Allergen Reaction Noted  . Amoxicillin Diarrhea and Other (See Comments) 04/07/2013  . Azithromycin Other (See Comments) 03/11/2013  . Dexilant [dexlansoprazole] Other (See Comments) 09/17/2013  . Lodine [etodolac] Palpitations 09/17/2013    Vitals: BP 104/65  Pulse 72  Ht 5\' 4"  (1.626 m)  Wt 138 lb (62.596 kg)  BMI 23.68 kg/m2 Last Weight:  Wt Readings from Last 1 Encounters:  10/28/13 138 lb (62.596 kg)   Last Height:   Ht Readings from Last 1 Encounters:  10/28/13 5\' 4"  (1.626 m)     Physical exam: Exam: Gen: NAD, conversant Eyes: anicteric sclerae, moist conjunctivae HENT: Atraumatic, oropharynx clear Neck: Trachea midline; supple,  Lungs: CTA, no wheezing, rales, rhonic                          CV: RRR, no MRG Abdomen: Soft, non-tender;  Extremities: No peripheral edema  Skin: Normal temperature, no rash,  Psych: Appropriate affect, pleasant  Neuro: MS: AA&Ox3, appropriately interactive, normal affect   Speech: fluent w/o paraphasic error  Memory: good recent and remote recall  CN: PERRL, EOMI no nystagmus, no ptosis, sensation intact to LT V1-V3 bilat, face symmetric, no weakness, hearing grossly intact, palate elevates symmetrically, shoulder shrug 5/5 bilat,  tongue protrudes midline, no fasiculations noted.  Motor: normal bulk and tone Strength: 5/5  In all extremities No abnormal involuntary movements or myoclonic jerks noted  Coord: rapid alternating and point-to-point (FNF, HTS) movements intact.  Reflexes: brisk reflexes with spread,  bilat downgoing toes  Sens: LT intact in all extremities  Gait: posture, stance, stride and arm-swing normal. Tandem gait intact. Able to walk on heels and toes. Romberg absent.    Assessment:  After physical and neurologic  examination, review of laboratory studies, imaging, neurophysiology testing and pre-existing records, assessment will be reviewed on the problem list.  Plan:  Treatment plan and additional workup will be reviewed under Problem List.  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 Unclear etiology of symptoms, workup has been unremarkable so far. 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 No further workup indicated at this time. Counseled patient and spouse on this diagnosis, they expressed understanding. Folllow up as needed. They will call if symptoms worsen.   Elspeth Cho, DO  [facility] [address] [place], [place] [address]  Phone [phone] Fax [phone]

## 2013-12-10 ENCOUNTER — Other Ambulatory Visit: Payer: Self-pay | Admitting: Family Medicine

## 2013-12-10 DIAGNOSIS — R222 Localized swelling, mass and lump, trunk: Secondary | ICD-10-CM

## 2013-12-11 ENCOUNTER — Ambulatory Visit
Admission: RE | Admit: 2013-12-11 | Discharge: 2013-12-11 | Disposition: A | Discharge status: Home / Self Care | Payer: BC Managed Care – PPO | Source: Ambulatory Visit | Attending: Family Medicine | Admitting: Family Medicine

## 2013-12-11 DIAGNOSIS — R222 Localized swelling, mass and lump, trunk: Secondary | ICD-10-CM

## 2013-12-15 ENCOUNTER — Other Ambulatory Visit: Payer: Self-pay | Admitting: Family Medicine

## 2013-12-15 DIAGNOSIS — R229 Localized swelling, mass and lump, unspecified: Secondary | ICD-10-CM

## 2013-12-22 ENCOUNTER — Other Ambulatory Visit: Payer: BC Managed Care – PPO

## 2013-12-24 ENCOUNTER — Other Ambulatory Visit: Payer: BC Managed Care – PPO

## 2014-01-12 ENCOUNTER — Ambulatory Visit (INDEPENDENT_AMBULATORY_CARE_PROVIDER_SITE_OTHER): Payer: BC Managed Care – PPO | Admitting: General Surgery

## 2014-01-12 ENCOUNTER — Encounter (INDEPENDENT_AMBULATORY_CARE_PROVIDER_SITE_OTHER): Payer: Self-pay | Admitting: General Surgery

## 2014-01-12 VITALS — BP 122/78 | HR 73 | Temp 97.4°F | Ht 63.0 in | Wt 136.0 lb

## 2014-01-12 DIAGNOSIS — R222 Localized swelling, mass and lump, trunk: Secondary | ICD-10-CM

## 2014-01-12 DIAGNOSIS — R229 Localized swelling, mass and lump, unspecified: Secondary | ICD-10-CM

## 2014-01-12 NOTE — Progress Notes (Signed)
Chief Complaint  Patient presents with  . eval back lipoma    HISTORY:  Jo Mitchell is a 38 y.o. female who presents to the office with a mass in her right back.  She has noticed this for the last few months.  It causes her discomfort when it moves but not pain.  She has no change in ROM.  She has never had something like this before.  She had an [place].    Past Medical History  Diagnosis Date  . Heart palpitations     hx  cefeene related, cut back on caf. - no current problems  . History of hypothyroidism     postpartum 2005  -- resolved after 6 months w/ meds. resolved  . Wears glasses   . Menorrhagia   . Endometrial mass   . Mild acid reflux     occasional - med prn  . Vitamin D deficiency   . HA (headache)   . Thyroid disease   . Vertigo   . Myoclonus        Past Surgical History  Procedure Laterality Date  . Cesarean section  11-25-2003  . Incision and drainage perirectal abscess N/A 03/15/2013    Procedure: IRRIGATION AND DEBRIDEMENT PERIRECTAL ABSCESS;  Surgeon: Lodema Pilot, DO;  Location: [facility];  Service: General;  Laterality: N/A;  . Inguinal hernia repair Left 1999  . Evaluation under anesthesia with anal fistulectomy N/A 04/09/2013    Procedure: EXAM UNDER ANESTHESIA , FISTULOTOMY;  Surgeon: Romie Levee, MD;  Location: [facility];  Service: General;  Laterality: N/A;  . Dilatation & currettage/hysteroscopy with resectocope N/A 06/12/2013    Procedure: DILATATION & CURETTAGE/HYSTEROSCOPY WITH RESECTOCOPE;  Surgeon: Serita Kyle, MD;  Location: [facility];  Service: Gynecology;  Laterality: N/A;      Current Outpatient Prescriptions  Medication Sig Dispense Refill  . ibuprofen (ADVIL,MOTRIN) 800 MG tablet Take 1 tablet (800 mg total) by mouth every 8 (eight) hours as needed.  30 tablet  1  . Probiotic Product (PROBIOTIC DAILY PO) Take by mouth.      . ranitidine (ZANTAC) 150 MG tablet Take 150 mg by mouth 2 (two) times daily as needed for  heartburn.       No current facility-administered medications for this visit.     Allergies  Allergen Reactions  . Amoxicillin Diarrhea and Other (See Comments)    Dizziness/ palpitations  . Azithromycin Other (See Comments)    Dizziness, palpitations  . Dexilant [Dexlansoprazole] Other (See Comments)    dizziness  . Lodine [Etodolac] Palpitations      History reviewed. No pertinent family history.    History   Social History  . Marital Status: Married    Spouse Name: N/A    Number of Children: 1  . Years of Education: 12+   Occupational History  . Student     Social History Main Topics  . Smoking status: Never Smoker   . Smokeless tobacco: Never Used  . Alcohol Use: No  . Drug Use: No  . Sexual Activity: Yes    Birth Control/ Protection: Condom   Other Topics Concern  . None   Social History Narrative   Patient lives at home with husband. 1 child   Student obtaining PhD   Patient is right handed.    Patient does not drink any caffeine.             REVIEW OF SYSTEMS - PERTINENT POSITIVES ONLY: Review of Systems -  General ROS: negative for - chills or fever Respiratory ROS: no cough, shortness of breath, or wheezing Cardiovascular ROS: no chest pain or dyspnea on exertion Gastrointestinal ROS: no abdominal pain, change in bowel habits, or black or bloody stools Genito-Urinary ROS: no dysuria, trouble voiding, or hematuria  EXAM: Filed Vitals:   01/12/14 0906  BP: 122/78  Pulse: 73  Temp: 97.4 F (36.3 C)    General appearance: alert and cooperative Resp: clear to auscultation bilaterally Cardio: regular rate and rhythm GI: normal findings: soft, non-tender Back: mobile, subfascial mass inferior to scapula, no tenderness to palpatation   RADIOLOGY RESULTS:   Images and reports are reviewed. Back [place] 1. Solid mass in the deep subcutaneous tissues corresponding to the  palpable lesion measuring 5.4 x 1.5 cm. Although statistically  likely to  represent benign lipoma, the fact that the patient  perceives this to be getting larger and causing discomfort indicates  the need for further imaging workup. I recommend Chest MRI with and  without contrast in order to further characterize.   ASSESSMENT AND PLAN: Jo Mitchell has a mobile mass subfascially in her right back.  We discussed that statistically this is most likely benign.  I recommended MRI vs following the lesion for growth or symptoms.  She has decided to follow this for now.  We can obtain an MRI if this continues to enlarge or cause symptoms.      Vanita Panda, MD Colon and Rectal Surgery / General Surgery [facility]      Visit Diagnoses: No diagnosis found.  Primary Care Physician: Neldon Labella, MD

## 2014-01-12 NOTE — Patient Instructions (Signed)
Call your doctor if the mass enlarges or becomes more symptomatic

## 2016-10-11 ENCOUNTER — Other Ambulatory Visit: Payer: Self-pay | Admitting: General Surgery

## 2016-10-24 ENCOUNTER — Encounter (HOSPITAL_BASED_OUTPATIENT_CLINIC_OR_DEPARTMENT_OTHER): Payer: Self-pay | Admitting: *Deleted

## 2016-10-24 NOTE — Progress Notes (Signed)
[facility], urine pregnancy on arrival-Npo after Mn-

## 2016-10-31 ENCOUNTER — Ambulatory Visit (HOSPITAL_BASED_OUTPATIENT_CLINIC_OR_DEPARTMENT_OTHER)
Admission: RE | Admit: 2016-10-31 | Discharge: 2016-10-31 | Disposition: A | Discharge status: Home / Self Care | Payer: BC Managed Care – PPO | Source: Ambulatory Visit | Attending: General Surgery | Admitting: General Surgery

## 2016-10-31 ENCOUNTER — Encounter (HOSPITAL_BASED_OUTPATIENT_CLINIC_OR_DEPARTMENT_OTHER): Payer: Self-pay | Admitting: *Deleted

## 2016-10-31 ENCOUNTER — Ambulatory Visit (HOSPITAL_BASED_OUTPATIENT_CLINIC_OR_DEPARTMENT_OTHER): Payer: BC Managed Care – PPO | Admitting: Anesthesiology

## 2016-10-31 ENCOUNTER — Encounter (HOSPITAL_BASED_OUTPATIENT_CLINIC_OR_DEPARTMENT_OTHER)
Admission: RE | Disposition: A | Discharge status: Home / Self Care | Payer: Self-pay | Source: Ambulatory Visit | Attending: General Surgery

## 2016-10-31 DIAGNOSIS — E559 Vitamin D deficiency, unspecified: Secondary | ICD-10-CM | POA: Diagnosis not present

## 2016-10-31 DIAGNOSIS — K219 Gastro-esophageal reflux disease without esophagitis: Secondary | ICD-10-CM | POA: Insufficient documentation

## 2016-10-31 DIAGNOSIS — Z79899 Other long term (current) drug therapy: Secondary | ICD-10-CM | POA: Diagnosis not present

## 2016-10-31 DIAGNOSIS — Z880 Allergy status to penicillin: Secondary | ICD-10-CM | POA: Insufficient documentation

## 2016-10-31 DIAGNOSIS — E039 Hypothyroidism, unspecified: Secondary | ICD-10-CM | POA: Diagnosis not present

## 2016-10-31 DIAGNOSIS — K603 Anal fistula: Secondary | ICD-10-CM | POA: Insufficient documentation

## 2016-10-31 DIAGNOSIS — G253 Myoclonus: Secondary | ICD-10-CM | POA: Diagnosis not present

## 2016-10-31 HISTORY — PX: ANAL FISTULOTOMY: SHX6423

## 2016-10-31 HISTORY — PX: RECTAL EXAM UNDER ANESTHESIA: SHX6399

## 2016-10-31 LAB — POCT PREGNANCY, URINE: PREG TEST UR: NEGATIVE

## 2016-10-31 LAB — POCT HEMOGLOBIN-HEMACUE: HEMOGLOBIN: 12.7 g/dL (ref 12.0–15.0)

## 2016-10-31 SURGERY — ANAL FISTULOTOMY
Anesthesia: Monitor Anesthesia Care | Site: Rectum

## 2016-10-31 MED ORDER — ACETAMINOPHEN 650 MG RE SUPP
650.0000 mg | RECTAL | Status: DC | PRN
  Filled 2016-10-31: qty 1

## 2016-10-31 MED ORDER — ACETAMINOPHEN 325 MG PO TABS
650.0000 mg | ORAL_TABLET | ORAL | Status: DC | PRN
  Filled 2016-10-31: qty 2

## 2016-10-31 MED ORDER — LIDOCAINE 5 % EX OINT
TOPICAL_OINTMENT | CUTANEOUS | Status: DC | PRN
  Administered 2016-10-31: 1

## 2016-10-31 MED ORDER — LIDOCAINE HCL (CARDIAC) 20 MG/ML IV SOLN
INTRAVENOUS | Status: DC | PRN
  Administered 2016-10-31: 50 mg via INTRAVENOUS

## 2016-10-31 MED ORDER — FENTANYL CITRATE (PF) 100 MCG/2ML IJ SOLN
INTRAMUSCULAR | Status: AC
  Filled 2016-10-31: qty 2

## 2016-10-31 MED ORDER — SODIUM CHLORIDE 0.9% FLUSH
3.0000 mL | Freq: Two times a day (BID) | INTRAVENOUS | Status: DC
  Filled 2016-10-31: qty 3

## 2016-10-31 MED ORDER — ONDANSETRON HCL 4 MG/2ML IJ SOLN
INTRAMUSCULAR | Status: DC | PRN
  Administered 2016-10-31: 4 mg via INTRAVENOUS

## 2016-10-31 MED ORDER — PROPOFOL 500 MG/50ML IV EMUL
INTRAVENOUS | Status: AC
  Filled 2016-10-31: qty 50

## 2016-10-31 MED ORDER — ONDANSETRON HCL 4 MG/2ML IJ SOLN
4.0000 mg | Freq: Once | INTRAMUSCULAR | Status: DC | PRN
  Filled 2016-10-31: qty 2

## 2016-10-31 MED ORDER — SODIUM CHLORIDE 0.9% FLUSH
3.0000 mL | INTRAVENOUS | Status: DC | PRN
  Filled 2016-10-31: qty 3

## 2016-10-31 MED ORDER — FENTANYL CITRATE (PF) 100 MCG/2ML IJ SOLN
INTRAMUSCULAR | Status: DC | PRN
  Administered 2016-10-31: 50 ug via INTRAVENOUS

## 2016-10-31 MED ORDER — LACTATED RINGERS IV SOLN
INTRAVENOUS | Status: DC
  Administered 2016-10-31 (×2): via INTRAVENOUS
  Filled 2016-10-31: qty 1000

## 2016-10-31 MED ORDER — MIDAZOLAM HCL 5 MG/5ML IJ SOLN
INTRAMUSCULAR | Status: DC | PRN
  Administered 2016-10-31: 2 mg via INTRAVENOUS

## 2016-10-31 MED ORDER — TRAMADOL HCL 50 MG PO TABS: 50.0000 mg | ORAL_TABLET | Freq: Four times a day (QID) | ORAL | 0 refills | Status: DC | PRN

## 2016-10-31 MED ORDER — PROPOFOL 500 MG/50ML IV EMUL
INTRAVENOUS | Status: DC | PRN
  Administered 2016-10-31: 200 ug/kg/min via INTRAVENOUS

## 2016-10-31 MED ORDER — MEPERIDINE HCL 25 MG/ML IJ SOLN
6.2500 mg | INTRAMUSCULAR | Status: DC | PRN
  Filled 2016-10-31: qty 1

## 2016-10-31 MED ORDER — BUPIVACAINE-EPINEPHRINE 0.5% -1:200000 IJ SOLN
INTRAMUSCULAR | Status: DC | PRN
  Administered 2016-10-31: 30 mL

## 2016-10-31 MED ORDER — HYDROMORPHONE HCL 1 MG/ML IJ SOLN
0.2500 mg | INTRAMUSCULAR | Status: DC | PRN
  Filled 2016-10-31: qty 0.5

## 2016-10-31 MED ORDER — DEXAMETHASONE SODIUM PHOSPHATE 4 MG/ML IJ SOLN
INTRAMUSCULAR | Status: DC | PRN
  Administered 2016-10-31: 10 mg via INTRAVENOUS

## 2016-10-31 MED ORDER — SODIUM CHLORIDE 0.9 % IV SOLN
250.0000 mL | INTRAVENOUS | Status: DC | PRN
  Filled 2016-10-31: qty 250

## 2016-10-31 MED ORDER — MIDAZOLAM HCL 2 MG/2ML IJ SOLN
INTRAMUSCULAR | Status: AC
  Filled 2016-10-31: qty 2

## 2016-10-31 SURGICAL SUPPLY — 65 items
APL SKNCLS STERI-STRIP NONHPOA (GAUZE/BANDAGES/DRESSINGS) ×1
BENZOIN TINCTURE PRP APPL 2/3 (GAUZE/BANDAGES/DRESSINGS) ×3 IMPLANT
BLADE HEX COATED 2.75 (ELECTRODE) ×3 IMPLANT
BLADE SURG 10 STRL SS (BLADE) IMPLANT
BLADE SURG 15 STRL LF DISP TIS (BLADE) IMPLANT
BLADE SURG 15 STRL SS (BLADE) ×3
BRIEF STRETCH FOR OB PAD LRG (UNDERPADS AND DIAPERS) ×6 IMPLANT
CANISTER SUCT 3000ML PPV (MISCELLANEOUS) ×3 IMPLANT
CATH IV 18G (IV SOLUTION) IMPLANT
COVER BACK TABLE 60X90IN (DRAPES) ×3 IMPLANT
COVER MAYO STAND STRL (DRAPES) ×3 IMPLANT
DECANTER SPIKE VIAL GLASS SM (MISCELLANEOUS) ×3 IMPLANT
DRAPE LAPAROTOMY 100X72 PEDS (DRAPES) ×3 IMPLANT
DRAPE LG THREE QUARTER DISP (DRAPES) ×6 IMPLANT
DRAPE UNDERBUTTOCKS STRL (DRAPE) IMPLANT
DRAPE UTILITY XL STRL (DRAPES) ×3 IMPLANT
DRSG PAD ABDOMINAL 8X10 ST (GAUZE/BANDAGES/DRESSINGS) ×2 IMPLANT
ELECT BLADE 6.5 .24CM SHAFT (ELECTRODE) IMPLANT
ELECT REM PT RETURN 9FT ADLT (ELECTROSURGICAL) ×3
ELECTRODE REM PT RTRN 9FT ADLT (ELECTROSURGICAL) ×1 IMPLANT
GAUZE SPONGE 4X4 12PLY STRL (GAUZE/BANDAGES/DRESSINGS) ×3 IMPLANT
GAUZE SPONGE 4X4 12PLY STRL LF (GAUZE/BANDAGES/DRESSINGS) IMPLANT
GAUZE SPONGE 4X4 16PLY XRAY LF (GAUZE/BANDAGES/DRESSINGS) IMPLANT
GAUZE SPONGE 4X4 8PLY STR LF (GAUZE/BANDAGES/DRESSINGS) ×3 IMPLANT
GLOVE BIO SURGEON STRL SZ 6.5 (GLOVE) ×4 IMPLANT
GLOVE BIO SURGEONS STRL SZ 6.5 (GLOVE) ×2
GLOVE BIOGEL PI IND STRL 7.0 (GLOVE) ×1 IMPLANT
GLOVE BIOGEL PI IND STRL 7.5 (GLOVE) IMPLANT
GLOVE BIOGEL PI INDICATOR 7.0 (GLOVE) ×4
GLOVE BIOGEL PI INDICATOR 7.5 (GLOVE) ×2
GLOVE ECLIPSE 7.0 STRL STRAW (GLOVE) ×2 IMPLANT
GLOVE INDICATOR 7.0 STRL GRN (GLOVE) ×6 IMPLANT
GOWN SPEC L3 XXLG W/TWL (GOWN DISPOSABLE) ×1 IMPLANT
GOWN STRL REUS W/TWL LRG LVL3 (GOWN DISPOSABLE) ×3 IMPLANT
GOWN STRL REUS W/TWL XL LVL3 (GOWN DISPOSABLE) ×3 IMPLANT
IV CATH 14GX2 1/4 (CATHETERS) IMPLANT
KIT RM TURNOVER CYSTO AR (KITS) ×3 IMPLANT
LEGGING LITHOTOMY PAIR STRL (DRAPES) IMPLANT
LOOP VESSEL MAXI BLUE (MISCELLANEOUS) IMPLANT
NDL SAFETY ECLIPSE 18X1.5 (NEEDLE) IMPLANT
NEEDLE HYPO 18GX1.5 SHARP (NEEDLE)
NEEDLE HYPO 22GX1.5 SAFETY (NEEDLE) ×3 IMPLANT
NS IRRIG 500ML POUR BTL (IV SOLUTION) ×3 IMPLANT
PACK BASIN DAY SURGERY FS (CUSTOM PROCEDURE TRAY) ×3 IMPLANT
PAD ABD 8X10 STRL (GAUZE/BANDAGES/DRESSINGS) ×2 IMPLANT
PAD ARMBOARD 7.5X6 YLW CONV (MISCELLANEOUS) IMPLANT
PENCIL BUTTON HOLSTER BLD 10FT (ELECTRODE) ×3 IMPLANT
SPONGE HEMORRHOID 8X3CM (HEMOSTASIS) IMPLANT
SPONGE SURGIFOAM ABS GEL 100 (HEMOSTASIS) IMPLANT
SPONGE SURGIFOAM ABS GEL 12-7 (HEMOSTASIS) IMPLANT
SUT CHROMIC 2 0 SH (SUTURE) IMPLANT
SUT CHROMIC 3 0 SH 27 (SUTURE) ×2 IMPLANT
SUT ETHIBOND 0 (SUTURE) IMPLANT
SUT GUT CHROMIC 3 0 (SUTURE) IMPLANT
SUT SILK 3 0 SH CR/8 (SUTURE) ×1 IMPLANT
SUT VIC AB 4-0 P-3 18XBRD (SUTURE) IMPLANT
SUT VIC AB 4-0 P3 18 (SUTURE)
SUT VICRYL 3-0 CR8 SH (SUTURE) ×3 IMPLANT
SYR CONTROL 10ML LL (SYRINGE) ×3 IMPLANT
SYRINGE 10CC LL (SYRINGE) IMPLANT
TOWEL OR 17X24 6PK STRL BLUE (TOWEL DISPOSABLE) ×5 IMPLANT
TRAY DSU PREP LF (CUSTOM PROCEDURE TRAY) ×3 IMPLANT
TUBE CONNECTING 12'X1/4 (SUCTIONS) ×1
TUBE CONNECTING 12X1/4 (SUCTIONS) ×2 IMPLANT
YANKAUER SUCT BULB TIP NO VENT (SUCTIONS) ×3 IMPLANT

## 2016-10-31 NOTE — Transfer of Care (Signed)
Immediate Anesthesia Transfer of Care Note  Patient: Rhett Bannister  Procedure(s) Performed: Procedure(s): ANAL FISTULOTOMY (N/A) ANAL EXAM UNDER ANESTHESIA (N/A)  Patient Location: PACU  Anesthesia Type:MAC  Level of Consciousness: sedated and patient cooperative  Airway & Oxygen Therapy: Patient Spontanous Breathing and Patient connected to nasal cannula oxygen  Post-op Assessment: Report given to RN and Post -op Vital signs reviewed and stable  Post vital signs: Reviewed and stable  Last Vitals:  Vitals:   10/31/16 0721  BP: (!) 99/56  Pulse: 61  Resp: 16  Temp: 36.8 C    Last Pain:  Vitals:   10/31/16 0721  TempSrc: Oral      Patients Stated Pain Goal: 7 (94/58/59 2924)  Complications: No apparent anesthesia complications

## 2016-10-31 NOTE — Anesthesia Preprocedure Evaluation (Addendum)
Anesthesia Evaluation  Patient identified by MRN, date of birth, ID band Patient awake    Reviewed: Allergy & Precautions, NPO status , Patient's Chart, lab work & pertinent test results  Airway Mallampati: I  TM Distance: >3 FB Neck ROM: Full    Dental  (+) Teeth Intact, Dental Advisory Given   Pulmonary    Pulmonary exam normal        Cardiovascular Exercise Tolerance: Good Normal cardiovascular exam Rhythm:Regular Rate:Normal     Neuro/Psych    GI/Hepatic GERD  Medicated and Controlled,  Endo/Other  Hypothyroidism   Renal/GU      Musculoskeletal   Abdominal   Peds  Hematology   Anesthesia Other Findings   Reproductive/Obstetrics                           Anesthesia Physical Anesthesia Plan  ASA: II  Anesthesia Plan: MAC   Post-op Pain Management:    Induction: Intravenous  PONV Risk Score and Plan: 2 and Ondansetron  Airway Management Planned: Simple Face Mask  Additional Equipment:   Intra-op Plan:   Post-operative Plan:   Informed Consent: I have reviewed the patients History and Physical, chart, labs and discussed the procedure including the risks, benefits and alternatives for the proposed anesthesia with the patient or authorized representative who has indicated his/her understanding and acceptance.     Plan Discussed with: CRNA and Surgeon  Anesthesia Plan Comments:         Anesthesia Quick Evaluation

## 2016-10-31 NOTE — Anesthesia Procedure Notes (Signed)
Procedure Name: MAC Date/Time: 10/31/2016 8:28 AM Performed by: Wanita Chamberlain Pre-anesthesia Checklist: Patient identified, Timeout performed, Emergency Drugs available, Suction available and Patient being monitored Patient Re-evaluated:Patient Re-evaluated prior to inductionOxygen Delivery Method: Nasal cannula Preoxygenation: Pre-oxygenation with 100% oxygen Intubation Type: IV induction Placement Confirmation: CO2 detector and positive ETCO2 Dental Injury: Teeth and Oropharynx as per pre-operative assessment

## 2016-10-31 NOTE — Anesthesia Postprocedure Evaluation (Signed)
Anesthesia Post Note  Patient: Rhett Bannister  Procedure(s) Performed: Procedure(s) (LRB): ANAL FISTULOTOMY (N/A) ANAL EXAM UNDER ANESTHESIA (N/A)     Patient location during evaluation: PACU Anesthesia Type: MAC Level of consciousness: awake and alert Pain management: pain level controlled Vital Signs Assessment: post-procedure vital signs reviewed and stable Respiratory status: spontaneous breathing, nonlabored ventilation, respiratory function stable and patient connected to nasal cannula oxygen Cardiovascular status: blood pressure returned to baseline and stable Postop Assessment: no signs of nausea or vomiting Anesthetic complications: no    Last Vitals:  Vitals:   10/31/16 0915 10/31/16 0930  BP: (!) 88/51 (!) 95/51  Pulse: (!) 55 (!) 50  Resp: 15 15  Temp:      Last Pain:  Vitals:   10/31/16 0859  TempSrc:   PainSc: [place] [facility]

## 2016-10-31 NOTE — H&P (Addendum)
HPI: The patient is a 41 year old female who presents with anal pain and recurrent perirectal drainage. She is status post a fistulotomy in 2014. She reports an abscess that appeared in mid January of this year. It ruptured on his own using sitz baths. She has then continued to have drainage for several weeks every few days. She denies any bleeding. She does have chronically loose stools but no frank diarrhea or blood per rectum. She underwent treatment for H. pylori and has noticed a decrease in her loose stools.  Past Medical History:  Diagnosis Date  . Endometrial mass   . HA (headache)    occassional  . Heart palpitations    hx  caffeine related, cut back . - no current problems  . History of hypothyroidism    postpartum 2005  -- resolved after 6 months w/ meds. resolved  . Hypothyroidism    during pregnancy only  . Menorrhagia   . Mild acid reflux    occasional - med prn  . Myoclonus   . Thyroid disease    during her pregnancy only 2005  . Vertigo   . Vitamin D deficiency   . Wears glasses    Past Surgical History:  Procedure Laterality Date  . CESAREAN SECTION  11-25-2003  . DILATATION & CURRETTAGE/HYSTEROSCOPY WITH RESECTOCOPE N/A 06/12/2013   Procedure: DILATATION & CURETTAGE/HYSTEROSCOPY WITH RESECTOCOPE;  Surgeon: Serita KyleSheronette A Cousins, MD;  Location: [facility];  Service: Gynecology;  Laterality: N/A;  . EVALUATION UNDER ANESTHESIA WITH ANAL FISTULECTOMY N/A 04/09/2013   Procedure: EXAM UNDER ANESTHESIA , FISTULOTOMY;  Surgeon: Romie LeveeAlicia Eriberto Felch, MD;  [facility] [place];  Service: General;  Laterality: N/A;  . INCISION AND DRAINAGE PERIRECTAL ABSCESS N/A 03/15/2013   Procedure: IRRIGATION AND DEBRIDEMENT PERIRECTAL ABSCESS;  Surgeon: Lodema PilotBrian Layton, DO;  Location: [facility];  Service: General;  Laterality: N/A;  . INGUINAL HERNIA REPAIR Left 1999   Social History   Social History  . Marital status: Married    Spouse name: N/A  . Number of children: 1  .  Years of education: 12+   Occupational History  . Student     Social History Main Topics  . Smoking status: Never Smoker  . Smokeless tobacco: Never Used  . Alcohol use No  . Drug use: No  . Sexual activity: Yes    Birth control/ protection: Condom   Other Topics Concern  . Not on file   Social History Narrative   Patient lives at home with husband. 1 child   Student obtaining PhD   Patient is right handed.    Patient does not drink any caffeine.         History reviewed. No pertinent family history.  Allergies  Allergen Reactions  . Amoxicillin Diarrhea and Other (See Comments)    Dizziness/ palpitations  . Azithromycin Other (See Comments)    Dizziness, palpitations  . Dexilant [Dexlansoprazole] Other (See Comments)    dizziness  . Lodine [Etodolac] Palpitations   No current facility-administered medications on file prior to encounter.    Current Outpatient Prescriptions on File Prior to Encounter  Medication Sig Dispense Refill  . ibuprofen (ADVIL,MOTRIN) 800 MG tablet Take 1 tablet (800 mg total) by mouth every 8 (eight) hours as needed. (Patient taking differently: Take 800 mg by mouth as needed. ) 30 tablet 1  . Probiotic Product (PROBIOTIC DAILY PO) Take by mouth.    . ranitidine (ZANTAC) 150 MG tablet Take 150 mg by mouth as needed  for heartburn.      Review of Systems - General ROS: negative for - chills or fever Respiratory ROS: no cough, shortness of breath, or wheezing Cardiovascular ROS: no chest pain or dyspnea on exertion Gastrointestinal ROS: no abdominal pain, change in bowel habits, or black or bloody stools negative Genito-Urinary ROS: no dysuria, trouble voiding, or hematuria   Physical Exam  Constitutional: She is oriented to person, place, and time. She appears well-developed and well-nourished.  HENT:  Head: Normocephalic and atraumatic.  Eyes: EOM are normal. Pupils are equal, round, and reactive to light.  Neck: Normal range of motion.  Neck supple.  Cardiovascular: Normal rate and regular rhythm.   Pulmonary/Chest: Effort normal.  Abdominal: Soft. She exhibits no distension.  Musculoskeletal: Normal range of motion.  Neurological: She is alert and oriented to person, place, and time.  Skin: Skin is warm and dry.   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  I am concerned for fistula development.  I have recommended an anal EUA and possible fistulotomy or seton placement.  We have discussed the risks in detail including a small risk of incontinence if fistulotomy is performed.  I believe she understands this and has agreed to proceed.

## 2016-10-31 NOTE — Op Note (Addendum)
10/31/2016  8:33 AM  PATIENT:  Jo Mitchell  41 y.o. female  Patient Care Team: Kathyrn Lass, MD as PCP - General (Family Medicine)  PRE-OPERATIVE DIAGNOSIS:  ANAL PAIN  POST-OPERATIVE DIAGNOSIS:  ANAL PAIN  PROCEDURE:  Procedure(s): FISTULOTOMY ANAL EXAM UNDER ANESTHESIA  SURGEON:  Surgeon(s): Leighton Ruff, MD  ASSISTANT: none   ANESTHESIA:   local and MAC  SPECIMEN:  No Specimen  DISPOSITION OF SPECIMEN:  N/A  COUNTS:  YES  PLAN OF CARE: Discharge to home after PACU  PATIENT DISPOSITION:  PACU - hemodynamically stable.  INDICATION: 41 y.o. 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   OR FINDINGS: small R lateral intersphincteric fistula  DESCRIPTION: the patient was identified in the preoperative holding area and taken to the OR where they were laid on the operating room table.  MAc anesthesia was induced without difficulty. The patient was then positioned in prone jackknife position with buttocks gently taped apart.  The patient was then prepped and draped in usual sterile fashion.  SCDs were noted to be in place prior to the initiation of anesthesia. A surgical timeout was performed indicating the correct patient, procedure, positioning and need for preoperative antibiotics.  A rectal block was performed using Marcaine with epinephrine.    I began with a digital rectal exam.  There were no masses palpated.  I then placed a Hill-Ferguson anoscope into the anal canal and evaluated this completely.  There was sphincter hypertension noted.  I identified an internal opening in the intersphincteric groove on the right lateral anal canal. A fistula probe easily inserted into this and exited at the dentate line. This involved the most distal portion of the internal sphincter, approximately 15%. Given she had minimal muscle dissection on the previous fistulotomy I decided to perform an additional fistulotomy on this side.  The edges were marsupialized using a 3-0 chromic suture. Lidocaine ointment and a dressing were applied. The patient was awakened from anesthesia and sent to the postanesthesia care unit in stable condition. All counts were correct per operating room staff.

## 2016-10-31 NOTE — Discharge Instructions (Addendum)

## 2016-11-01 ENCOUNTER — Encounter (HOSPITAL_BASED_OUTPATIENT_CLINIC_OR_DEPARTMENT_OTHER): Payer: Self-pay | Admitting: General Surgery

## 2017-03-08 ENCOUNTER — Other Ambulatory Visit: Payer: Self-pay | Admitting: Obstetrics and Gynecology

## 2017-03-08 DIAGNOSIS — N644 Mastodynia: Secondary | ICD-10-CM

## 2017-03-14 ENCOUNTER — Ambulatory Visit
Admission: RE | Admit: 2017-03-14 | Discharge: 2017-03-14 | Disposition: A | Discharge status: Home / Self Care | Payer: BC Managed Care – PPO | Source: Ambulatory Visit | Attending: Obstetrics and Gynecology | Admitting: Obstetrics and Gynecology

## 2017-03-14 DIAGNOSIS — N644 Mastodynia: Secondary | ICD-10-CM

## 2018-05-25 DIAGNOSIS — I82409 Acute embolism and thrombosis of unspecified deep veins of unspecified lower extremity: Secondary | ICD-10-CM

## 2018-05-25 HISTORY — DX: Acute embolism and thrombosis of unspecified deep veins of unspecified lower extremity: I82.409

## 2018-06-12 ENCOUNTER — Emergency Department (HOSPITAL_COMMUNITY)
Admission: EM | Admit: 2018-06-12 | Discharge: 2018-06-13 | Disposition: A | Discharge status: Home / Self Care | Payer: BC Managed Care – PPO | Attending: Emergency Medicine | Admitting: Emergency Medicine

## 2018-06-12 ENCOUNTER — Ambulatory Visit (HOSPITAL_BASED_OUTPATIENT_CLINIC_OR_DEPARTMENT_OTHER)
Admission: RE | Admit: 2018-06-12 | Discharge: 2018-06-12 | Disposition: A | Discharge status: Home / Self Care | Payer: BC Managed Care – PPO | Source: Ambulatory Visit | Attending: Cardiovascular Disease | Admitting: Cardiovascular Disease

## 2018-06-12 ENCOUNTER — Other Ambulatory Visit: Payer: Self-pay

## 2018-06-12 ENCOUNTER — Encounter (HOSPITAL_COMMUNITY): Payer: Self-pay | Admitting: *Deleted

## 2018-06-12 ENCOUNTER — Encounter (HOSPITAL_COMMUNITY): Payer: Self-pay

## 2018-06-12 ENCOUNTER — Other Ambulatory Visit (HOSPITAL_COMMUNITY): Payer: Self-pay | Admitting: Orthopedic Surgery

## 2018-06-12 DIAGNOSIS — M7989 Other specified soft tissue disorders: Secondary | ICD-10-CM | POA: Insufficient documentation

## 2018-06-12 DIAGNOSIS — M79605 Pain in left leg: Secondary | ICD-10-CM

## 2018-06-12 DIAGNOSIS — F419 Anxiety disorder, unspecified: Secondary | ICD-10-CM | POA: Diagnosis not present

## 2018-06-12 DIAGNOSIS — I82422 Acute embolism and thrombosis of left iliac vein: Secondary | ICD-10-CM | POA: Diagnosis not present

## 2018-06-12 DIAGNOSIS — I824Z2 Acute embolism and thrombosis of unspecified deep veins of left distal lower extremity: Secondary | ICD-10-CM

## 2018-06-12 DIAGNOSIS — R002 Palpitations: Secondary | ICD-10-CM | POA: Insufficient documentation

## 2018-06-12 DIAGNOSIS — Z79899 Other long term (current) drug therapy: Secondary | ICD-10-CM | POA: Diagnosis not present

## 2018-06-12 LAB — BASIC METABOLIC PANEL
ANION GAP: 10 (ref 5–15)
BUN: 7 mg/dL (ref 6–20)
CALCIUM: 9 mg/dL (ref 8.9–10.3)
CHLORIDE: 105 mmol/L (ref 98–111)
CO2: 22 mmol/L (ref 22–32)
CREATININE: 0.71 mg/dL (ref 0.44–1.00)
GFR calc non Af Amer: >60 mL/min (ref >60)
Glucose, Bld: 126 mg/dL — ABNORMAL HIGH (ref 70–99)
Potassium: 3.6 mmol/L (ref 3.5–5.1)
SODIUM: 137 mmol/L (ref 135–145)

## 2018-06-12 LAB — CBC
HCT: 43.7 % (ref 36.0–46.0)
Hemoglobin: 13.5 g/dL (ref 12.0–15.0)
MCH: 26.7 pg (ref 26.0–34.0)
MCHC: 30.9 g/dL (ref 30.0–36.0)
MCV: 86.5 fL (ref 80.0–100.0)
NRBC: 0 % (ref 0.0–0.2)
Platelets: 285 10*3/uL (ref 150–400)
RBC: 5.05 MIL/uL (ref 3.87–5.11)
RDW: 12.9 % (ref 11.5–15.5)
WBC: 7.2 10*3/uL (ref 4.0–10.5)

## 2018-06-12 LAB — TROPONIN I

## 2018-06-12 NOTE — ED Triage Notes (Signed)
Pt reports she was at her orthopedic doctor ordered and U/S today and she has a blood clot in the left leg. She reports she was started on Eliquis. Reports she took the dose tonight and about an hour after she says she started having palpitations, nausea and just not feeling well. Denies pain.

## 2018-06-12 NOTE — Progress Notes (Signed)
LLE venous duplex test completed. See results under Chart Review-CV proc. Evidence of left peroneal vein DVT. Spoke with Dr. Sherene Sires P.A. who instructed patient to return to their office for further instruction.

## 2018-06-12 NOTE — ED Notes (Signed)
Pt brought back for reevaluation says she is still "feeling more palpitations". VSS. Radial pulse is regular, normal. Says she is feeling pressure in her chest, with frequent belching, says "it feels like gas"

## 2018-06-13 ENCOUNTER — Emergency Department (HOSPITAL_COMMUNITY): Payer: BC Managed Care – PPO

## 2018-06-13 LAB — I-STAT BETA HCG BLOOD, ED (MC, WL, AP ONLY)

## 2018-06-13 MED ORDER — IOPAMIDOL (ISOVUE-370) INJECTION 76%
100.0000 mL | Freq: Once | INTRAVENOUS | Status: AC | PRN
  Administered 2018-06-13: 100 mL via INTRAVENOUS

## 2018-06-13 NOTE — ED Notes (Signed)
Patient transported to CT 

## 2018-06-13 NOTE — Discharge Instructions (Addendum)
Continue taking your apixaban (Eliquis) exactly as it is prescribed.

## 2018-06-13 NOTE — ED Provider Notes (Signed)
[facility] EMERGENCY [facility]PARTMENT Provider Note   CSN: [id number] Arrival date & time: 06/12/18  1941     History   Chief Complaint Chief Complaint  Patient presents with  . Palpitations    HPI Jo Mitchell is a 43 y.o. female.  The history is provided by the patient.  She has history of palpitations, hypothyroidism, myoclonus and comes in because of feeling jittery.  She had an ACL injury to her left knee, and had noted some swelling of her left calf and went for venous Doppler earlier today with diagnosis of DVT and was started on apixaban.  This evening, she states she felt generally jittery.  There was some minimal dyspnea but no chest pain.  She did not have any true palpitations, just felt generally jittery.  She denies fever or chills.  She denies nausea or vomiting.  Past Medical History:  Diagnosis Date  . Endometrial mass   . HA (headache)    occassional  . Heart palpitations    hx  caffeine related, cut back . - no current problems  . History of hypothyroidism    postpartum 2005  -- resolved after 6 months w/ meds. resolved  . Hypothyroidism    during pregnancy only  . Menorrhagia   . Mild acid reflux    occasional - med prn  . Myoclonus   . Thyroid disease    during her pregnancy only 2005  . Vertigo   . Vitamin D deficiency   . Wears glasses     Patient Active Problem List   Diagnosis Date Noted  . 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 ANAL FISTULOTOMY N/A 10/31/2016   Procedure: ANAL FISTULOTOMY;  Surgeon: Romie Leveehomas, Alicia, MD;  [facility];  Service: General;  Laterality: N/A;  . CESAREAN SECTION  11-25-2003  . DILATATION & CURRETTAGE/HYSTEROSCOPY WITH RESECTOCOPE N/A 06/12/2013   Procedure: DILATATION & CURETTAGE/HYSTEROSCOPY WITH RESECTOCOPE;  Surgeon: Serita KyleSheronette A Cousins, MD;  Location: [facility];  Service: Gynecology;  Laterality: N/A;  . EVALUATION UNDER ANESTHESIA  WITH ANAL FISTULECTOMY N/A 04/09/2013   Procedure: EXAM UNDER ANESTHESIA , FISTULOTOMY;  Surgeon: Romie LeveeAlicia Thomas, MD;  [facility] [place];  Service: General;  Laterality: N/A;  . INCISION AND DRAINAGE PERIRECTAL ABSCESS N/A 03/15/2013   Procedure: IRRIGATION AND DEBRIDEMENT PERIRECTAL ABSCESS;  Surgeon: Lodema PilotBrian Layton, DO;  Location: [facility];  Service: General;  Laterality: N/A;  . INGUINAL HERNIA REPAIR Left 1999  . RECTAL EXAM UNDER ANESTHESIA N/A 10/31/2016   Procedure: ANAL EXAM UNDER ANESTHESIA;  Surgeon: Romie Leveehomas, Alicia, MD;  [facility] [place] [place];  Service: General;  Laterality: N/A;     OB History   No obstetric history on file.      Home Medications    Prior to Admission medications   Medication Sig Start Date End Date Taking? Authorizing Provider  cholecalciferol (VITAMIN D) 1000 units tablet Take 1,000 Units by mouth daily.    [provider]  ibuprofen (ADVIL,MOTRIN) 800 MG tablet Take 1 tablet (800 mg total) by mouth every 8 (eight) hours as needed. Patient taking differently: Take 800 mg by mouth as needed.  06/12/13   Maxie Betterousins, Sheronette, MD  ranitidine (ZANTAC) 150 MG tablet Take 150 mg by mouth as needed for heartburn.     [provider]  traMADol (ULTRAM) 50 MG tablet Take 1-2 tablets (50-100 mg total) by mouth every 6 (six) hours as needed. 10/31/16  Romie Levee, MD    Family History No family history on file.  Social History Social History   Tobacco Use  . Smoking status: Never Smoker  . Smokeless tobacco: Never Used  Substance Use Topics  . Alcohol use: No  . Drug use: No     Allergies   Amoxicillin; Azithromycin; Dexilant [dexlansoprazole]; and Lodine [etodolac]   Review of Systems Review of Systems  All other systems reviewed and are negative.    Physical Exam Updated Vital Signs BP (!) 101/57   Pulse 70   Temp 98.7 F (37.1 C) (Oral)   Resp 18   Ht 5\' 4"  (1.626 m)   Wt 65.8 kg   LMP  05/15/2018   SpO2 100%   BMI 24.89 kg/m   Physical Exam Vitals signs and nursing note reviewed.    43 year old female, resting comfortably and in no acute distress. Vital signs are normal. Oxygen saturation is 100%, which is normal. Head is normocephalic and atraumatic. PERRLA, EOMI. Oropharynx is clear. Neck is nontender and supple without adenopathy or JVD. Back is nontender and there is no CVA tenderness. Lungs are clear without rales, wheezes, or rhonchi. Chest is nontender. Heart has regular rate and rhythm without murmur. Abdomen is soft, flat, nontender without masses or hepatosplenomegaly and peristalsis is normoactive. Extremities have no cyanosis or edema, full range of motion is present.  Left calf circumference is 1 cm greater than right calf circumference. Skin is warm and dry without rash. Neurologic: Mental status is normal, cranial nerves are intact, there are no motor or sensory deficits.  ED Treatments / Results  Labs (all labs ordered are listed, but only abnormal results are displayed) Labs Reviewed  BASIC METABOLIC PANEL - Abnormal; Notable for the following components:      Result Value   Glucose, Bld 126 (*)    All other components within normal limits  CBC  TROPONIN I  I-STAT BETA HCG BLOOD, ED ([facility], [facility], [facility] ONLY)    EKG EKG Interpretation  Date/Time:  Thursday June 12 2018 19:57:26 EST Ventricular Rate:  76 PR Interval:  134 QRS Duration: 76 QT Interval:  374 QTC Calculation: 420 R Axis:   59 Text Interpretation:  Normal sinus rhythm Biatrial enlargement Pulmonary disease pattern T wave abnormality, consider inferior ischemia Abnormal ECG No old tracing to compare Confirmed by Dione Booze ([id number]) on 06/13/2018 1:45:04 AM   Radiology Ct Angio Chest Pe W And/or Wo Contrast  Result Date: 06/13/2018 CLINICAL DATA:  43 year old female with chest pain. Concern for pulmonary embolism. EXAM: CT ANGIOGRAPHY CHEST WITH CONTRAST TECHNIQUE:  Multidetector CT imaging of the chest was performed using the standard protocol during bolus administration of intravenous contrast. Multiplanar CT image reconstructions and MIPs were obtained to evaluate the vascular anatomy. CONTRAST:  ISOVUE-370 IOPAMIDOL (ISOVUE-370) INJECTION 76% COMPARISON:  Chest radiograph dated 08/05/2015 FINDINGS: Cardiovascular: Top-normal cardiac size with mild dilatation of the atria. No pericardial effusion. The thoracic aorta is unremarkable. The origins of the great vessels of the aortic arch appear patent as visualized. There is no CT evidence of pulmonary embolism. Mediastinum/Nodes: No hilar or mediastinal adenopathy. Esophagus is grossly unremarkable. No mediastinal fluid collection. Lungs/Pleura: The lungs are clear. There is no pleural effusion or pneumothorax. The central airways are patent. Upper Abdomen: No acute abnormality. Musculoskeletal: No chest wall abnormality. No acute or significant osseous findings. Review of the MIP images confirms the above findings. IMPRESSION: No acute intrathoracic pathology. No CT evidence of pulmonary embolism.  Electronically Signed   By: Elgie CollardArash  Radparvar M.D.   On: 06/13/2018 02:44   Vas Koreas Lower Extremity Venous (dvt)  Result Date: 06/12/2018  Lower Venous Study Other Indications: Pain and swelling of the left lower extremity since Saturday. Risk Factors: Trauma Recent fall. Performing Technologist: Olegario Sheareranielle Schmitt RVT  Examination Guidelines: A complete evaluation includes B-mode imaging, spectral Doppler, color Doppler, and power Doppler as needed of all accessible portions of each vessel. Bilateral testing is considered an integral part of a complete examination. Limited examinations for reoccurring indications may be performed as noted.  Right Venous Findings: +---+---------------+---------+-----------+----------+-------+    CompressibilityPhasicitySpontaneityPropertiesSummary  +---+---------------+---------+-----------+----------+-------+ CFVFull           Yes      Yes                          +---+---------------+---------+-----------+----------+-------+  Left Venous Findings: +---------+---------------+---------+-----------+----------------+-------+          CompressibilityPhasicitySpontaneityProperties      Summary +---------+---------------+---------+-----------+----------------+-------+ CFV      Full           Yes      Yes                                +---------+---------------+---------+-----------+----------------+-------+ SFJ      Full           Yes      Yes                                +---------+---------------+---------+-----------+----------------+-------+ FV Prox  Full           Yes      Yes                                +---------+---------------+---------+-----------+----------------+-------+ FV Mid   Full           Yes      Yes                                +---------+---------------+---------+-----------+----------------+-------+ FV DistalFull           Yes      Yes                                +---------+---------------+---------+-----------+----------------+-------+ PFV      Full                                                       +---------+---------------+---------+-----------+----------------+-------+ POP      Full           Yes      Yes                                +---------+---------------+---------+-----------+----------------+-------+ PTV      Full           Yes      Yes                                +---------+---------------+---------+-----------+----------------+-------+  PERO     None           No       No         softly echogenicAcute   +---------+---------------+---------+-----------+----------------+-------+ Gastroc  Full                                                       +---------+---------------+---------+-----------+----------------+-------+ GSV       Full           Yes      Yes                                +---------+---------------+---------+-----------+----------------+-------+  Findings reported to Dr. Floydene Flock at 3:40 pm. Patient instructed to return to office for further instructions.  Summary: Right: No evidence of common femoral vein obstruction. Left: Findings consistent with acute deep vein thrombosis involving the left peroneal vein. No cystic structure found in the popliteal fossa. All other veins visualized appear fully compressible and demonstrate appropriate Doppler characteristics.  *See table(s) above for measurements and observations. Electronically signed by Julien Nordmann MD on 06/12/2018 at 9:27:14 PM.    Final     Procedures Procedures   Medications Ordered in ED Medications  iopamidol (ISOVUE-370) 76 % injection 100 mL (100 mLs Intravenous Contrast Given 06/13/18 0225)     Initial Impression / Assessment and Plan / ED Course  I have reviewed the triage vital signs and the nursing notes.  Pertinent labs & imaging results that were available during my care of the patient were reviewed by me and considered in my medical decision making (see chart for details).  Jitteriness following diagnosis of DVT which is probably anxiety.  However, she does have some dyspnea and clearly is at risk for pulmonary embolism so will be sent for CT angiogram.  No concerns regarding DVT as it was provoked by her knee injury with relative immobilization.  Old records are reviewed, and she has no relevant past visits.  CT scan shows no evidence of pulmonary embolism.  I have discussed these findings with the patient.  She is still concerned that apixaban has caused her jitteriness.  I have reassured her that the jittery feeling had nothing to do with apixaban and she needs to continue taking it exactly as it has been prescribed.  She is to follow-up with her PCP regarding her DVT, with her orthopedic physician regarding  long-term care of her left knee injury.  Final Clinical Impressions(s) / ED Diagnoses   Final diagnoses:  Palpitations  Anxiety  DVT, lower extremity, distal, acute, left [facility])    ED Discharge Orders    None       Dione Booze, MD 06/13/18 0301

## 2018-06-18 ENCOUNTER — Emergency Department (HOSPITAL_COMMUNITY): Payer: BC Managed Care – PPO

## 2018-06-18 ENCOUNTER — Emergency Department (HOSPITAL_COMMUNITY)
Admission: EM | Admit: 2018-06-18 | Discharge: 2018-06-19 | Disposition: A | Discharge status: Home / Self Care | Payer: BC Managed Care – PPO | Attending: Emergency Medicine | Admitting: Emergency Medicine

## 2018-06-18 ENCOUNTER — Other Ambulatory Visit: Payer: Self-pay

## 2018-06-18 ENCOUNTER — Encounter (HOSPITAL_COMMUNITY): Payer: Self-pay | Admitting: Emergency Medicine

## 2018-06-18 DIAGNOSIS — Z7901 Long term (current) use of anticoagulants: Secondary | ICD-10-CM | POA: Insufficient documentation

## 2018-06-18 DIAGNOSIS — Z79899 Other long term (current) drug therapy: Secondary | ICD-10-CM | POA: Insufficient documentation

## 2018-06-18 DIAGNOSIS — R072 Precordial pain: Secondary | ICD-10-CM | POA: Diagnosis not present

## 2018-06-18 DIAGNOSIS — R079 Chest pain, unspecified: Secondary | ICD-10-CM | POA: Diagnosis present

## 2018-06-18 LAB — BASIC METABOLIC PANEL
ANION GAP: 14 (ref 5–15)
BUN: 10 mg/dL (ref 6–20)
CHLORIDE: 100 mmol/L (ref 98–111)
CO2: 22 mmol/L (ref 22–32)
CREATININE: 0.68 mg/dL (ref 0.44–1.00)
Calcium: 9.2 mg/dL (ref 8.9–10.3)
GFR calc non Af Amer: >60 mL/min (ref >60)
GLUCOSE: 136 mg/dL — AB (ref 70–99)
POTASSIUM: 3.8 mmol/L (ref 3.5–5.1)
Sodium: 136 mmol/L (ref 135–145)

## 2018-06-18 LAB — CBC
HEMATOCRIT: 39.7 % (ref 36.0–46.0)
HEMOGLOBIN: 12.6 g/dL (ref 12.0–15.0)
MCH: 27.4 pg (ref 26.0–34.0)
MCHC: 31.7 g/dL (ref 30.0–36.0)
MCV: 86.3 fL (ref 80.0–100.0)
Platelets: 326 10*3/uL (ref 150–400)
RBC: 4.6 MIL/uL (ref 3.87–5.11)
RDW: 12.8 % (ref 11.5–15.5)
WBC: 5.9 10*3/uL (ref 4.0–10.5)
nRBC: 0 % (ref 0.0–0.2)

## 2018-06-18 LAB — I-STAT TROPONIN, ED: Troponin i, poc: 0 ng/mL (ref 0.00–0.08)

## 2018-06-18 MED ORDER — SODIUM CHLORIDE 0.9% FLUSH: 3.0000 mL | Freq: Once | INTRAVENOUS | Status: DC

## 2018-06-18 NOTE — ED Triage Notes (Signed)
C/o tightness across chest and SOB that started 1 hour ago.  Pt reports + DVT on 1/23.

## 2018-06-19 NOTE — ED Provider Notes (Signed)
[facility] EMERGENCY DEPARTMENT Provider Note   CSN: [id number] Arrival date & time: 06/18/18  2207     History   Chief Complaint Chief Complaint  Patient presents with  . Chest Pain    HPI Jo Mitchell is a 43 y.o. female.  The history is provided by the spouse and the patient.  Chest Pain  Pain location:  Substernal area Pain quality: burning and tightness   Pain radiates to:  Does not radiate Timing:  Constant Progression:  Resolved Chronicity:  New Context: eating   Relieved by:  Nothing Worsened by:  Nothing Associated symptoms: shortness of breath   Associated symptoms: no fever and no vomiting   Patient with recent history of DVT, on Eliquis presents with chest pain.  She reports earlier tonight after eating dinner she began having chest burning and tightness.  She had mild shortness of breath. This lasted several minutes and has since resolved. No further episodes.  No pleuritic pain.  No hemoptysis. Recently found to have a DVT left lower extremity is been on Eliquis.  She recently had her medications cut back due to intolerance.  She has not missed any medications.  She is now taking Eliquis 5 mg twice daily She was seen on January 23 in the ER and had a negative CT chest that time. Past Medical History:  Diagnosis Date  . Endometrial mass   . HA (headache)    occassional  . Heart palpitations    hx  caffeine related, cut back . - no current problems  . History of hypothyroidism    postpartum 2005  -- resolved after 6 months w/ meds. resolved  . Hypothyroidism    during pregnancy only  . Menorrhagia   . Mild acid reflux    occasional - med prn  . Myoclonus   . Thyroid disease    during her pregnancy only 2005  . Vertigo   . Vitamin D deficiency   . Wears glasses     Patient Active Problem List   Diagnosis Date Noted  . 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 ANAL FISTULOTOMY N/A  10/31/2016   Procedure: ANAL FISTULOTOMY;  Surgeon: Romie Levee, MD;  [facility];  Service: General;  Laterality: N/A;  . CESAREAN SECTION  11-25-2003  . DILATATION & CURRETTAGE/HYSTEROSCOPY WITH RESECTOCOPE N/A 06/12/2013   Procedure: DILATATION & CURETTAGE/HYSTEROSCOPY WITH RESECTOCOPE;  Surgeon: Serita Kyle, MD;  Location: [facility];  Service: Gynecology;  Laterality: N/A;  . EVALUATION UNDER ANESTHESIA WITH ANAL FISTULECTOMY N/A 04/09/2013   Procedure: EXAM UNDER ANESTHESIA , FISTULOTOMY;  Surgeon: Romie Levee, MD;  Location: [facility];  Service: General;  Laterality: N/A;  . INCISION AND DRAINAGE PERIRECTAL ABSCESS N/A 03/15/2013   Procedure: IRRIGATION AND DEBRIDEMENT PERIRECTAL ABSCESS;  Surgeon: Lodema Pilot, DO;  Location: [facility];  Service: General;  Laterality: N/A;  . INGUINAL HERNIA REPAIR Left 1999  . RECTAL EXAM UNDER ANESTHESIA N/A 10/31/2016   Procedure: ANAL EXAM UNDER ANESTHESIA;  Surgeon: Romie Levee, MD;  Location: [facility];  Service: General;  Laterality: N/A;     OB History   No obstetric history on file.      Home Medications    Prior to Admission medications   Medication Sig Start Date End Date Taking? Authorizing Provider  apixaban (ELIQUIS) 5 MG TABS tablet Take 5 mg by mouth 2 (two) times daily.  Yes [provider]  omeprazole (PRILOSEC) 20 MG capsule Take 20 mg by mouth daily.   Yes [provider]    Family History No family history on file.  Social History Social History   Tobacco Use  . Smoking status: Never Smoker  . Smokeless tobacco: Never Used  Substance Use Topics  . Alcohol use: No  . Drug use: No     Allergies   Amoxicillin; Azithromycin; Dexilant [dexlansoprazole]; and Lodine [etodolac]   Review of Systems Review of Systems  Constitutional: Negative for fever.  Respiratory: Positive for shortness of breath.   Cardiovascular: Positive  for chest pain.  Gastrointestinal: Negative for vomiting.  All other systems reviewed and are negative.    Physical Exam Updated Vital Signs BP 120/76   Pulse 68   Temp 98 F (36.7 C) (Oral)   Resp 14   Ht 1.626 m (5\' 4" )   Wt 63.5 kg   LMP 06/13/2018   SpO2 98%   BMI 24.03 kg/m   Physical Exam CONSTITUTIONAL: Well developed/well nourished HEAD: Normocephalic/atraumatic EYES: EOMI/PERRL ENMT: Mucous membranes moist NECK: supple no meningeal signs SPINE/BACK:entire spine nontender CV: S1/S2 noted, no murmurs/rubs/gallops noted LUNGS: Lungs are clear to auscultation bilaterally, no apparent distress ABDOMEN: soft, nontender, no rebound or guarding, bowel sounds noted throughout abdomen GU:no cva tenderness NEURO: Pt is awake/alert/appropriate, moves all extremitiesx4.  No facial droop.   EXTREMITIES: pulses normal/equal, full ROM, mild left calf tenderness, no significant lower extremity edema.  Distal pulses equal and intact SKIN: warm, color normal PSYCH: no abnormalities of mood noted, alert and oriented to situation   ED Treatments / Results  Labs (all labs ordered are listed, but only abnormal results are displayed) Labs Reviewed  BASIC METABOLIC PANEL - Abnormal; Notable for the following components:      Result Value   Glucose, Bld 136 (*)    All other components within normal limits  CBC  I-STAT TROPONIN, ED    EKG EKG Interpretation  Date/Time:  Wednesday June 18 2018 22:12:47 EST Ventricular Rate:  83 PR Interval:  130 QRS Duration: 70 QT Interval:  334 QTC Calculation: 392 R Axis:   68 Text Interpretation:  Normal sinus rhythm Biatrial enlargement T wave abnormality, consider inferolateral ischemia Abnormal ECG diffuse ST changes similar to 06/12/18 Confirmed by Marily MemosMesner, Jason [phone]([id number]) on 06/18/2018 10:27:08 PM   Radiology Dg Chest 2 View  Result Date: 06/18/2018 CLINICAL DATA:  Chest tightness EXAM: CHEST - 2 VIEW COMPARISON:  08/05/2015  FINDINGS: Heart and mediastinal contours are within normal limits. No focal opacities or effusions. No acute bony abnormality. IMPRESSION: No active cardiopulmonary disease. Electronically Signed   By: Charlett NoseKevin  Dover M.D.   On: 06/18/2018 22:55   Vas Koreas Lower Extremity Venous (dvt)  Result Date: 06/12/2018  Lower Venous Study Other Indications: Pain and swelling of the left lower extremity since Saturday. Risk Factors: Trauma Recent fall. Performing Technologist: Olegario Sheareranielle Schmitt RVT  Examination Guidelines: A complete evaluation includes B-mode imaging, spectral Doppler, color Doppler, and power Doppler as needed of all accessible portions of each vessel. Bilateral testing is considered an integral part of a complete examination. Limited examinations for reoccurring indications may be performed as noted.  Right Venous Findings: +---+---------------+---------+-----------+----------+-------+    CompressibilityPhasicitySpontaneityPropertiesSummary +---+---------------+---------+-----------+----------+-------+ CFVFull           Yes      Yes                          +---+---------------+---------+-----------+----------+-------+  Left Venous Findings: +---------+---------------+---------+-----------+----------------+-------+          CompressibilityPhasicitySpontaneityProperties      Summary +---------+---------------+---------+-----------+----------------+-------+ CFV      Full           Yes      Yes                                +---------+---------------+---------+-----------+----------------+-------+ SFJ      Full           Yes      Yes                                +---------+---------------+---------+-----------+----------------+-------+ FV Prox  Full           Yes      Yes                                +---------+---------------+---------+-----------+----------------+-------+ FV Mid   Full           Yes      Yes                                 +---------+---------------+---------+-----------+----------------+-------+ FV DistalFull           Yes      Yes                                +---------+---------------+---------+-----------+----------------+-------+ PFV      Full                                                       +---------+---------------+---------+-----------+----------------+-------+ POP      Full           Yes      Yes                                +---------+---------------+---------+-----------+----------------+-------+ PTV      Full           Yes      Yes                                +---------+---------------+---------+-----------+----------------+-------+ PERO     None           No       No         softly echogenicAcute   +---------+---------------+---------+-----------+----------------+-------+ Gastroc  Full                                                       +---------+---------------+---------+-----------+----------------+-------+ GSV      Full           Yes      Yes                                +---------+---------------+---------+-----------+----------------+-------+  Findings reported to Dr. Floydene Flock at 3:40 pm. Patient instructed to return to office for further instructions.  Summary: Right: No evidence of common femoral vein obstruction. Left: Findings consistent with acute deep vein thrombosis involving the left peroneal vein. No cystic structure found in the popliteal fossa. All other veins visualized appear fully compressible and demonstrate appropriate Doppler characteristics.  *See table(s) above for measurements and observations. Electronically signed by Julien Nordmann MD on 06/12/2018 at 9:27:14 PM.    Final     Procedures Procedures   Medications Ordered in ED Medications  sodium chloride flush (NS) 0.9 % injection 3 mL (has no administration in time range)     Initial Impression / Assessment and Plan / ED Course  I have reviewed the triage  vital signs and the nursing notes.  Pertinent labs & imaging results that were available during my care of the patient were reviewed by me and considered in my medical decision making (see chart for details).     At the time of my evaluation patient was improved.  No further episodes of chest pain.  Chest pain was postprandial, therefore could be gastrointestinal in nature.  She is low risk for ACS, with negative troponin.  Her EKG is unchanged.  She has continued taking Eliquis though has had her milligram dosing cut back due to intolerance.  My suspicion for acute PE is low given no hypoxia and she had negative CT chest about 1 week ago. Patient feels well and would like to be discharged home.  She will continue her Eliquis and follow-up as an outpatient. Final Clinical Impressions(s) / ED Diagnoses   Final diagnoses:  Precordial pain    ED Discharge Orders    None       Zadie Rhine, MD 06/19/18 [id number]

## 2018-06-19 NOTE — ED Notes (Signed)
Patient verbalizes understanding of medications and discharge instructions. No further questions at this time. VSS and patient ambulatory at discharge.   

## 2018-09-08 ENCOUNTER — Other Ambulatory Visit: Payer: Self-pay

## 2018-09-08 ENCOUNTER — Ambulatory Visit (HOSPITAL_COMMUNITY)
Admission: RE | Admit: 2018-09-08 | Discharge: 2018-09-08 | Disposition: A | Discharge status: Home / Self Care | Payer: BC Managed Care – PPO | Source: Ambulatory Visit | Attending: Cardiology | Admitting: Cardiology

## 2018-09-08 ENCOUNTER — Other Ambulatory Visit (HOSPITAL_COMMUNITY): Payer: Self-pay | Admitting: Orthopedic Surgery

## 2018-09-08 DIAGNOSIS — M79605 Pain in left leg: Secondary | ICD-10-CM | POA: Diagnosis present

## 2018-09-08 DIAGNOSIS — M7989 Other specified soft tissue disorders: Secondary | ICD-10-CM

## 2018-09-26 ENCOUNTER — Encounter: Payer: Self-pay | Admitting: Orthopedic Surgery

## 2018-12-31 ENCOUNTER — Other Ambulatory Visit: Payer: Self-pay

## 2018-12-31 ENCOUNTER — Encounter: Payer: Self-pay | Admitting: Cardiology

## 2018-12-31 ENCOUNTER — Ambulatory Visit: Payer: BC Managed Care – PPO | Admitting: Cardiology

## 2018-12-31 VITALS — BP 112/77 | HR 75 | Ht 64.0 in | Wt 146.0 lb

## 2018-12-31 DIAGNOSIS — R9431 Abnormal electrocardiogram [ECG] [EKG]: Secondary | ICD-10-CM | POA: Diagnosis not present

## 2018-12-31 DIAGNOSIS — Z86718 Personal history of other venous thrombosis and embolism: Secondary | ICD-10-CM

## 2018-12-31 DIAGNOSIS — R002 Palpitations: Secondary | ICD-10-CM

## 2018-12-31 HISTORY — DX: Palpitations: R00.2

## 2018-12-31 NOTE — Progress Notes (Signed)
Primary Physician:  Sigmund HazelMiller, Lisa, MD   Patient ID: Jo Mitchell, female    DOB: 09/11/1975, 43 y.o.   MRN: [id number]  Subjective:    Chief Complaint  Patient presents with  . Palpitations  . Follow-up    HPI: Jo ChiquitoYogeeta S Ferraz  is a 43 y.o. female  with hypothyroidism during pregnancy, GERD, chronic palpitations here for reevaluation of palpitations.  Last seen in 2017. Reports tearing her right ACL in January, then developed right DVT shortly after this. She was on Eliquis for 3 months and has since stopped. DVT has resolved. She is asking if she needs clotting disorder panel.   Since this, she has had worsening palpitations, mostly noticed at night. Described as fluttering. skipped beat. Can feel tired after these episodes.She has also noted worsening palpitations with PPI. She did notice increased BP with ibuprofen 200 mg, which is concerning. Palpitations generally worse with caffeine or new medications. She has not had any episodes during exercise. She has gained some weight with limited activity due to her knee.  No chest pain, SOB, PND, orthopnea, edema, dizziness, syncope, or symptoms suggestive of claudication or TIA. She does report headache at the top of her head particularly worsened with eating and moving her jaw.    Past Medical History:  Diagnosis Date  . Endometrial mass   . HA (headache)    occassional  . Heart palpitations    hx  caffeine related, cut back . - no current problems  . History of hypothyroidism    postpartum 2005  -- resolved after 6 months w/ meds. resolved  . Hypothyroidism    during pregnancy only  . Menorrhagia   . Mild acid reflux    occasional - med prn  . Myoclonus   . Palpitations 12/31/2018  . Thyroid disease    during her pregnancy only 2005  . Vertigo   . Vitamin D deficiency   . Wears glasses     Past Surgical History:  Procedure Laterality Date  . ANAL FISTULOTOMY N/A 10/31/2016   Procedure: ANAL FISTULOTOMY;  Surgeon:  Romie Leveehomas, Alicia, MD;  [facility] [place];  Service: General;  Laterality: N/A;  . CESAREAN SECTION  11-25-2003  . DILATATION & CURRETTAGE/HYSTEROSCOPY WITH RESECTOCOPE N/A 06/12/2013   Procedure: DILATATION & CURETTAGE/HYSTEROSCOPY WITH RESECTOCOPE;  Surgeon: Serita KyleSheronette A Cousins, MD;  Location: [facility];  Service: Gynecology;  Laterality: N/A;  . EVALUATION UNDER ANESTHESIA WITH ANAL FISTULECTOMY N/A 04/09/2013   Procedure: EXAM UNDER ANESTHESIA , FISTULOTOMY;  Surgeon: Romie LeveeAlicia Thomas, MD;  [facility] [place];  Service: General;  Laterality: N/A;  . INCISION AND DRAINAGE PERIRECTAL ABSCESS N/A 03/15/2013   Procedure: IRRIGATION AND DEBRIDEMENT PERIRECTAL ABSCESS;  Surgeon: Lodema PilotBrian Layton, DO;  Location: [facility];  Service: General;  Laterality: N/A;  . INGUINAL HERNIA REPAIR Left 1999  . RECTAL EXAM UNDER ANESTHESIA N/A 10/31/2016   Procedure: ANAL EXAM UNDER ANESTHESIA;  Surgeon: Romie Leveehomas, Alicia, MD;  [facility] [place];  Service: General;  Laterality: N/A;    Social History   Socioeconomic History  . Marital status: Married    Spouse name: Not on file  . Number of children: 1  . Years of education: 12+  . Highest education level: Not on file  Occupational History  . Occupation: Consulting civil engineertudent   Social Needs  . Financial resource strain: Not on file  . Food insecurity    Worry: Not on file    Inability: Not on file  .  Transportation needs    Medical: Not on file    Non-medical: Not on file  Tobacco Use  . Smoking status: Never Smoker  . Smokeless tobacco: Never Used  Substance and Sexual Activity  . Alcohol use: No  . Drug use: No  . Sexual activity: Yes    Birth control/protection: Condom  Lifestyle  . Physical activity    Days per week: Not on file    Minutes per session: Not on file  . Stress: Not on file  Relationships  . Social Musicianconnections    Talks on phone: Not on file    Gets together: Not on file    Attends religious  service: Not on file    Active member of club or organization: Not on file    Attends meetings of clubs or organizations: Not on file    Relationship status: Not on file  . Intimate partner violence    Fear of current or ex partner: Not on file    Emotionally abused: Not on file    Physically abused: Not on file    Forced sexual activity: Not on file  Other Topics Concern  . Not on file  Social History Narrative   Patient lives at home with husband. 1 child   Student obtaining PhD   Patient is right handed.    Patient does not drink any caffeine.          Review of Systems  Constitution: Positive for weight gain. Negative for decreased appetite, malaise/fatigue and weight loss.  Eyes: Negative for visual disturbance.  Cardiovascular: Positive for palpitations. Negative for chest pain, claudication, dyspnea on exertion, leg swelling, orthopnea and syncope.  Respiratory: Negative for hemoptysis and wheezing.   Endocrine: Negative for cold intolerance and heat intolerance.  Hematologic/Lymphatic: Does not bruise/bleed easily.  Skin: Negative for nail changes.  Musculoskeletal: Negative for muscle weakness and myalgias.  Gastrointestinal: Negative for abdominal pain, change in bowel habit, nausea and vomiting.  Neurological: Negative for difficulty with concentration, dizziness, focal weakness and headaches.  Psychiatric/Behavioral: Negative for altered mental status and suicidal ideas.  All other systems reviewed and are negative.     Objective:  Blood pressure 112/77, pulse 75, height 5\' 4"  (1.626 m), weight 146 lb (66.2 kg), SpO2 100 %. Body mass index is 25.06 kg/m.    Physical Exam  Constitutional: She is oriented to person, place, and time. Vital signs are normal. She appears well-developed and well-nourished.  HENT:  Head: Normocephalic and atraumatic.  Neck: Normal range of motion.  Cardiovascular: Normal rate, regular rhythm, normal heart sounds and intact distal  pulses.  Pulmonary/Chest: Effort normal and breath sounds normal. No accessory muscle usage. No respiratory distress.  Abdominal: Soft. Bowel sounds are normal.  Musculoskeletal: Normal range of motion.  Neurological: She is alert and oriented to person, place, and time.  Skin: Skin is warm and dry.  Vitals reviewed.  Radiology: No results found.  Laboratory examination:   CMP Latest Ref Rng & Units 06/18/2018 06/12/2018 09/18/2013  Glucose 70 - 99 mg/dL 161(W136(H) 960(A126(H) 77  BUN 6 - 20 mg/dL 10 7 8   Creatinine 0.44 - 1.00 mg/dL 5.400.68 9.810.71 1.910.71  Sodium 135 - 145 mmol/L 136 137 142  Potassium 3.5 - 5.1 mmol/L 3.8 3.6 4.7  Chloride 98 - 111 mmol/L 100 105 104  CO2 22 - 32 mmol/L 22 22 25   Calcium 8.9 - 10.3 mg/dL 9.2 9.0 8.7  Total Protein 6.0 - 8.5 g/dL - - 6.7  Total Bilirubin 0.0 - 1.2 mg/dL - - 0.6  Alkaline Phos 39 - 117 IU/L - - 41  AST 0 - 40 IU/L - - 15  ALT 0 - 32 IU/L - - 11   CBC Latest Ref Rng & Units 06/18/2018 06/12/2018 10/31/2016  WBC 4.0 - 10.5 K/uL 5.9 7.2 -  Hemoglobin 12.0 - 15.0 g/dL 12.6 13.5 12.7  Hematocrit 36.0 - 46.0 % 39.7 43.7 -  Platelets 150 - 400 K/uL 326 285 -   Lipid Panel  No results found for: CHOL, TRIG, HDL, CHOLHDL, VLDL, LDLCALC, LDLDIRECT HEMOGLOBIN A1C No results found for: HGBA1C, MPG TSH No results for input(s): TSH in the last 8760 hours.  PRN Meds:. Medications Discontinued During This Encounter  Medication Reason  . omeprazole (PRILOSEC) 20 MG capsule Error  . apixaban (ELIQUIS) 5 MG TABS tablet Error   Current Meds  Medication Sig  . cholecalciferol (VITAMIN D3) 25 MCG (1000 UT) tablet Take 1,000 Units by mouth 2 (two) times daily.  . [DISCONTINUED] omeprazole (PRILOSEC) 20 MG capsule Take 20 mg by mouth daily.    Cardiac Studies:   Event Monitor 30 days 02-Mar-2016: Syptomatic transmission of palpitations reveal NSR.  Treadmill exercise stress test 02/24/2016: Indications: Palpitations,Abn ECG The resting electrocardiogram  demonstrated normal sinus rhythm with an IRBBB, no resting arrhythmias and normal rest repolarization. The stress electrocardiogram was normal. There were no significant arrhythmias. Patient exercised on Bruce protocol for 9:00 minutes and achieved 91% of Max Predicted HR (Target HR was >85% MPHR) and 10.16 METS. Stress symptoms included fatigue and dyspnea. Normal BP response. Exercise capacity was normal. Impression: Normal stress EKG. No significant arrhythmias. Normal BP response. Abnormal EKG (R94.31)  Echocardiogram 02/23/2016: Left ventricle cavity is normal in size. Normal global wall motion. Normal diastolic filling pattern. Calculated EF 61%. Left atrial cavity is normal in size. Septal dropout, a PFO is probably present. Trace mitral regurgitation. Trace tricuspid regurgitation. No evidence of pulmonary hypertension. No significant change from 03/22/2011.  Assessment:     ICD-10-CM   1. Palpitations  R00.2 EKG 12-Lead  2. History of DVT of lower extremity  Z86.718   3. Abnormal EKG  R94.31     EKG 12/31/2018: Normal sinus rhythm at rate of 63 bpm, normal axis, incomplete right branch block. 1 mm ST segment depression with T wave inversion in the inferior leads and anterolateral leads, cannot exclude ischemia. Abnormal EKG. Unchanged from EKG March 02, 2016.   Recommendations:   Patient presents for follow up for worsening palpitations. Since her right leg DVT, she has had worsening palpitations. Her palpitations are suggestive of PAC/PVC. I have reassured her. I suspect her inactivity and weight gain secondary to this, has led to her worsening palpitations. Discussed diet modifications and once able to start exercising more, to start this to help with weight loss. Do not feel that she needs monitoring or further cardiac testing at this point. Palpitations have improved over the last 2 weeks. She has abnormal EKG that is unchanged from previous EKG in 2017. No symptoms of angina.  Encouraged her to continue to work on her stress that will also help with controlling palpitations.  She is questioning if she needs clotting panel, in view of her DVT likely be secondary to injury and her this is only her first occurrence of DVT, do not feel that clotting panel is indicated. I have recommended that she follow up in 2 years for reevaluation. She is scheduled to have labs performed in the next  few weeks, she would like to schedule virtual visit after this to discuss. Patient was pleased with our evaluation.   *I have discussed this case with Dr. Jacinto HalimGanji and he personally examined the patient and participated in formulating the plan.*   Toniann FailAshton Haynes Jamillah Camilo, MSN, APRN, FNP-C [facility] Office: [phone] Fax: [phone]

## 2019-01-04 ENCOUNTER — Encounter: Payer: Self-pay | Admitting: Cardiology

## 2019-01-13 ENCOUNTER — Other Ambulatory Visit: Payer: Self-pay | Admitting: Orthopedic Surgery

## 2019-01-13 DIAGNOSIS — M542 Cervicalgia: Secondary | ICD-10-CM

## 2019-01-13 DIAGNOSIS — M5412 Radiculopathy, cervical region: Secondary | ICD-10-CM

## 2019-02-01 ENCOUNTER — Other Ambulatory Visit: Payer: BC Managed Care – PPO

## 2019-02-02 ENCOUNTER — Other Ambulatory Visit: Payer: Self-pay | Admitting: Orthopedic Surgery

## 2019-02-02 DIAGNOSIS — M25511 Pain in right shoulder: Secondary | ICD-10-CM

## 2019-02-08 ENCOUNTER — Other Ambulatory Visit: Payer: Self-pay

## 2019-02-08 ENCOUNTER — Ambulatory Visit
Admission: RE | Admit: 2019-02-08 | Discharge: 2019-02-08 | Disposition: A | Discharge status: Home / Self Care | Payer: BC Managed Care – PPO | Source: Ambulatory Visit | Attending: Orthopedic Surgery | Admitting: Orthopedic Surgery

## 2019-02-08 DIAGNOSIS — M542 Cervicalgia: Secondary | ICD-10-CM

## 2019-02-08 DIAGNOSIS — M5412 Radiculopathy, cervical region: Secondary | ICD-10-CM

## 2019-02-15 ENCOUNTER — Ambulatory Visit
Admission: RE | Admit: 2019-02-15 | Discharge: 2019-02-15 | Disposition: A | Discharge status: Home / Self Care | Payer: BC Managed Care – PPO | Source: Ambulatory Visit | Attending: Orthopedic Surgery | Admitting: Orthopedic Surgery

## 2019-02-15 ENCOUNTER — Other Ambulatory Visit: Payer: Self-pay

## 2019-02-15 DIAGNOSIS — M25511 Pain in right shoulder: Secondary | ICD-10-CM

## 2019-02-18 ENCOUNTER — Encounter (HOSPITAL_BASED_OUTPATIENT_CLINIC_OR_DEPARTMENT_OTHER): Payer: Self-pay | Admitting: *Deleted

## 2019-02-18 ENCOUNTER — Other Ambulatory Visit: Payer: Self-pay

## 2019-02-18 DIAGNOSIS — M7501 Adhesive capsulitis of right shoulder: Secondary | ICD-10-CM | POA: Diagnosis present

## 2019-02-18 NOTE — H&P (Signed)
Jo Mitchell is an 43 y.o. female.   Chief Complaint: right shoulder pain and stiffness HPI: Jo Mitchell is a 43 year-old seen for follow up from her significant right shoulder pain and cervical spine pain and eight months status post left knee ACL tear with postoperative DVT.  She continues to have significant right shoulder pain with decreased range of motion.  Pain in the right rhomboid and intermittent left knee pain as well.  She has been in physical therapy for her shoulder, but this has not been helping and in fact her range of motion is decreasing.  She cannot tolerate NSAIDs.    Past Medical History:  Diagnosis Date  . DVT (deep venous thrombosis) (HCC) 05/25/2018   eliquis x 3 mos  . Endometrial mass   . HA (headache)    occassional  . Heart palpitations    hx  caffeine related, cut back . - no current problems  . History of hypothyroidism    postpartum 2005  -- resolved after 6 months w/ meds. resolved  . Hypothyroidism    during pregnancy only  . Menorrhagia   . Mild acid reflux    occasional - med prn  . Myoclonus   . Palpitations 12/31/2018  . Thyroid disease    during her pregnancy only 2005  . Vertigo   . Vitamin D deficiency   . Wears glasses     Past Surgical History:  Procedure Laterality Date  . ANAL FISTULOTOMY N/A 10/31/2016   Procedure: ANAL FISTULOTOMY;  Surgeon: Romie Levee, MD;  Location: [facility];  Service: General;  Laterality: N/A;  . CESAREAN SECTION  11-25-2003  . DILATATION & CURRETTAGE/HYSTEROSCOPY WITH RESECTOCOPE N/A 06/12/2013   Procedure: DILATATION & CURETTAGE/HYSTEROSCOPY WITH RESECTOCOPE;  Surgeon: Serita Kyle, MD;  Location: [facility];  Service: Gynecology;  Laterality: N/A;  . EVALUATION UNDER ANESTHESIA WITH ANAL FISTULECTOMY N/A 04/09/2013   Procedure: EXAM UNDER ANESTHESIA , FISTULOTOMY;  Surgeon: Romie Levee, MD;  Location: [facility] [place];  Service: General;  Laterality: N/A;  . INCISION AND  DRAINAGE PERIRECTAL ABSCESS N/A 03/15/2013   Procedure: IRRIGATION AND DEBRIDEMENT PERIRECTAL ABSCESS;  Surgeon: Lodema Pilot, DO;  Location: [facility];  Service: General;  Laterality: N/A;  . INGUINAL HERNIA REPAIR Left 1999  . RECTAL EXAM UNDER ANESTHESIA N/A 10/31/2016   Procedure: ANAL EXAM UNDER ANESTHESIA;  Surgeon: Romie Levee, MD;  Location: [facility] ;  Service: General;  Laterality: N/A;    History reviewed. No pertinent family history. Social History:  reports that she has never smoked. She has never used smokeless tobacco. She reports that she does not drink alcohol or use drugs.  Allergies:  Allergies  Allergen Reactions  . Amoxicillin Diarrhea and Other (See Comments)    Dizziness/ palpitations  . Azithromycin Other (See Comments)    Dizziness, palpitations  . Dexilant [Dexlansoprazole] Other (See Comments)    dizziness  . Lodine [Etodolac] Palpitations  . Nsaids Other (See Comments)    Elevates blood pressure    No medications prior to admission.    No results found for this or any previous visit (from the past 48 hour(s)). No results found.  Review of Systems  Constitutional: Negative.   HENT: Negative.   Eyes: Negative.   Respiratory: Negative.   Cardiovascular: Negative.   Gastrointestinal: Negative.   Genitourinary: Negative.   Musculoskeletal: Positive for joint pain and neck pain.  Skin: Negative.   Neurological: Negative.   Endo/Heme/Allergies: Negative.  Psychiatric/Behavioral: Negative.     Height 5\' 4"  (1.626 m), weight 64.9 kg, last menstrual period 01/29/2019. Physical Exam  Constitutional: She appears well-developed and well-nourished.  HENT:  Head: Normocephalic and atraumatic.  Eyes: Pupils are equal, round, and reactive to light. EOM are normal.  Neck: Neck supple.  Cardiovascular: Normal rate.  Respiratory: Effort normal.  GI: Soft.  Genitourinary:    Genitourinary Comments: Not pertinent to current symptomatology  therefore not examined.   Musculoskeletal:     Comments: Well-developed, well-nourished female in no acute distress.  Alert and oriented.  Examination of her right shoulder reveals 50% range of motion with pain.  Mild weakness.  No instability.  Examination of her left shoulder reveals full range of motion without pain, weakness or instability.  Examination of her cervical spine reveals right rhomboid pain.  She has pain in the cervical spine on the right.  Range of motion of the cervical spine is decreased by 20%.  Examination of her left knee reveals no effusion.  1+ Lachman.  She has full range of motion.  Knee is otherwise stable with normal patella tracking.  Examination of the right knee reveals full range of motion without pain, swelling, weakness or instability.    Skin: Skin is warm and dry.  Psychiatric: She has a normal mood and affect. Her behavior is normal.     Assessment Principal Problem:   Adhesive capsulitis of right shoulder   Plan Right shoulder cortisone injections and manipulation under anesthesia.  The risks, benefits, and possible complications of the procedure were discussed in detail with the patient.  The patient is without question.  Jo Litton Earl Lagos, PA-C 02/18/2019, 5:34 PM

## 2019-02-19 ENCOUNTER — Other Ambulatory Visit (HOSPITAL_COMMUNITY)
Admission: RE | Admit: 2019-02-19 | Discharge: 2019-02-19 | Disposition: A | Discharge status: Home / Self Care | Payer: BC Managed Care – PPO | Source: Ambulatory Visit | Attending: Orthopedic Surgery | Admitting: Orthopedic Surgery

## 2019-02-19 DIAGNOSIS — Z01812 Encounter for preprocedural laboratory examination: Secondary | ICD-10-CM | POA: Insufficient documentation

## 2019-02-19 DIAGNOSIS — Z20828 Contact with and (suspected) exposure to other viral communicable diseases: Secondary | ICD-10-CM | POA: Diagnosis not present

## 2019-02-20 LAB — NOVEL CORONAVIRUS, NAA (HOSP ORDER, SEND-OUT TO REF LAB; TAT 18-24 HRS): SARS-CoV-2, NAA: NOT DETECTED

## 2019-02-23 ENCOUNTER — Ambulatory Visit (HOSPITAL_BASED_OUTPATIENT_CLINIC_OR_DEPARTMENT_OTHER): Payer: BC Managed Care – PPO | Admitting: Anesthesiology

## 2019-02-23 ENCOUNTER — Other Ambulatory Visit: Payer: Self-pay

## 2019-02-23 ENCOUNTER — Encounter (HOSPITAL_BASED_OUTPATIENT_CLINIC_OR_DEPARTMENT_OTHER): Payer: Self-pay

## 2019-02-23 ENCOUNTER — Ambulatory Visit (HOSPITAL_BASED_OUTPATIENT_CLINIC_OR_DEPARTMENT_OTHER)
Admission: RE | Admit: 2019-02-23 | Discharge: 2019-02-23 | Disposition: A | Discharge status: Home / Self Care | Payer: BC Managed Care – PPO | Attending: Orthopedic Surgery | Admitting: Orthopedic Surgery

## 2019-02-23 ENCOUNTER — Encounter (HOSPITAL_BASED_OUTPATIENT_CLINIC_OR_DEPARTMENT_OTHER)
Admission: RE | Disposition: A | Discharge status: Home / Self Care | Payer: Self-pay | Source: Home / Self Care | Attending: Orthopedic Surgery

## 2019-02-23 DIAGNOSIS — Z86718 Personal history of other venous thrombosis and embolism: Secondary | ICD-10-CM | POA: Diagnosis not present

## 2019-02-23 DIAGNOSIS — M19011 Primary osteoarthritis, right shoulder: Secondary | ICD-10-CM | POA: Diagnosis not present

## 2019-02-23 DIAGNOSIS — M7501 Adhesive capsulitis of right shoulder: Secondary | ICD-10-CM | POA: Insufficient documentation

## 2019-02-23 DIAGNOSIS — K219 Gastro-esophageal reflux disease without esophagitis: Secondary | ICD-10-CM | POA: Insufficient documentation

## 2019-02-23 DIAGNOSIS — M25511 Pain in right shoulder: Secondary | ICD-10-CM | POA: Diagnosis present

## 2019-02-23 HISTORY — PX: SHOULDER CLOSED REDUCTION: SHX1051

## 2019-02-23 HISTORY — PX: SHOULDER INJECTION: SHX5048

## 2019-02-23 SURGERY — INJECTION, SHOULDER
Anesthesia: General | Site: Shoulder | Laterality: Right

## 2019-02-23 MED ORDER — FENTANYL CITRATE (PF) 100 MCG/2ML IJ SOLN: 25.0000 ug | INTRAMUSCULAR | Status: DC | PRN

## 2019-02-23 MED ORDER — ONDANSETRON HCL 4 MG/2ML IJ SOLN
INTRAMUSCULAR | Status: DC | PRN
  Administered 2019-02-23: 4 mg via INTRAVENOUS

## 2019-02-23 MED ORDER — BUPIVACAINE HCL (PF) 0.75 % IJ SOLN
INTRAMUSCULAR | Status: DC | PRN
  Administered 2019-02-23: 25 mL via PERINEURAL

## 2019-02-23 MED ORDER — OXYCODONE HCL 5 MG/5ML PO SOLN: 5.0000 mg | Freq: Once | ORAL | Status: DC | PRN

## 2019-02-23 MED ORDER — ONDANSETRON HCL 4 MG/2ML IJ SOLN
INTRAMUSCULAR | Status: AC
  Filled 2019-02-23: qty 2

## 2019-02-23 MED ORDER — DEXAMETHASONE SODIUM PHOSPHATE 4 MG/ML IJ SOLN
INTRAMUSCULAR | Status: DC | PRN
  Administered 2019-02-23: 10 mg via INTRAVENOUS

## 2019-02-23 MED ORDER — PROPOFOL 10 MG/ML IV BOLUS
INTRAVENOUS | Status: AC
  Filled 2019-02-23: qty 40

## 2019-02-23 MED ORDER — ONDANSETRON HCL 4 MG/2ML IJ SOLN: 4.0000 mg | Freq: Once | INTRAMUSCULAR | Status: DC | PRN

## 2019-02-23 MED ORDER — MIDAZOLAM HCL 2 MG/2ML IJ SOLN
INTRAMUSCULAR | Status: AC
  Filled 2019-02-23: qty 2

## 2019-02-23 MED ORDER — LIDOCAINE 2% (20 MG/ML) 5 ML SYRINGE
INTRAMUSCULAR | Status: AC
  Filled 2019-02-23: qty 5

## 2019-02-23 MED ORDER — LACTATED RINGERS IV SOLN: INTRAVENOUS | Status: DC

## 2019-02-23 MED ORDER — FENTANYL CITRATE (PF) 100 MCG/2ML IJ SOLN
INTRAMUSCULAR | Status: AC
  Filled 2019-02-23: qty 2

## 2019-02-23 MED ORDER — MEPERIDINE HCL 25 MG/ML IJ SOLN: 6.2500 mg | INTRAMUSCULAR | Status: DC | PRN

## 2019-02-23 MED ORDER — DEXAMETHASONE SODIUM PHOSPHATE 10 MG/ML IJ SOLN
INTRAMUSCULAR | Status: AC
  Filled 2019-02-23: qty 1

## 2019-02-23 MED ORDER — OXYCODONE HCL 5 MG PO TABS: 5.0000 mg | ORAL_TABLET | Freq: Once | ORAL | Status: DC | PRN

## 2019-02-23 MED ORDER — FENTANYL CITRATE (PF) 100 MCG/2ML IJ SOLN
INTRAMUSCULAR | Status: DC | PRN
  Administered 2019-02-23: 25 ug via INTRAVENOUS

## 2019-02-23 MED ORDER — PROPOFOL 10 MG/ML IV BOLUS
INTRAVENOUS | Status: DC | PRN
  Administered 2019-02-23: 30 mg via INTRAVENOUS

## 2019-02-23 MED ORDER — ACETAMINOPHEN 325 MG PO TABS: 325.0000 mg | ORAL_TABLET | ORAL | Status: DC | PRN

## 2019-02-23 MED ORDER — BUPIVACAINE HCL (PF) 0.5 % IJ SOLN
INTRAMUSCULAR | Status: AC
  Filled 2019-02-23: qty 90

## 2019-02-23 MED ORDER — LACTATED RINGERS IV SOLN
INTRAVENOUS | Status: DC
  Administered 2019-02-23: 09:00:00 via INTRAVENOUS

## 2019-02-23 MED ORDER — LIDOCAINE 2% (20 MG/ML) 5 ML SYRINGE
INTRAMUSCULAR | Status: DC | PRN
  Administered 2019-02-23: 25 mg via INTRAVENOUS

## 2019-02-23 MED ORDER — FENTANYL CITRATE (PF) 100 MCG/2ML IJ SOLN
50.0000 ug | INTRAMUSCULAR | Status: DC | PRN
  Administered 2019-02-23: 100 ug via INTRAVENOUS

## 2019-02-23 MED ORDER — DEXAMETHASONE SODIUM PHOSPHATE 10 MG/ML IJ SOLN: 8.0000 mg | Freq: Once | INTRAMUSCULAR | Status: DC

## 2019-02-23 MED ORDER — CLONIDINE HCL (ANALGESIA) 100 MCG/ML EP SOLN
EPIDURAL | Status: DC | PRN
  Administered 2019-02-23: 100 ug

## 2019-02-23 MED ORDER — METHYLPREDNISOLONE ACETATE 80 MG/ML IJ SUSP
INTRAMUSCULAR | Status: DC | PRN
  Administered 2019-02-23: 80 mg

## 2019-02-23 MED ORDER — MIDAZOLAM HCL 2 MG/2ML IJ SOLN
1.0000 mg | INTRAMUSCULAR | Status: DC | PRN
  Administered 2019-02-23: 2 mg via INTRAVENOUS

## 2019-02-23 MED ORDER — ACETAMINOPHEN 160 MG/5ML PO SOLN: 325.0000 mg | ORAL | Status: DC | PRN

## 2019-02-23 MED ORDER — HYDROCODONE-ACETAMINOPHEN 5-325 MG PO TABS: 1.0000 | ORAL_TABLET | ORAL | 0 refills | Status: AC | PRN

## 2019-02-23 MED ORDER — METHYLPREDNISOLONE ACETATE 40 MG/ML IJ SUSP
INTRAMUSCULAR | Status: AC
  Filled 2019-02-23: qty 1

## 2019-02-23 MED ORDER — BUPIVACAINE HCL (PF) 0.25 % IJ SOLN
INTRAMUSCULAR | Status: AC
  Filled 2019-02-23: qty 30

## 2019-02-23 MED ORDER — BUPIVACAINE-EPINEPHRINE 0.5% -1:200000 IJ SOLN
INTRAMUSCULAR | Status: DC | PRN
  Administered 2019-02-23: 10 mL

## 2019-02-23 SURGICAL SUPPLY — 24 items
BNDG ADH 1X3 SHEER STRL LF (GAUZE/BANDAGES/DRESSINGS) ×2 IMPLANT
BNDG ADH THN 3X1 STRL LF (GAUZE/BANDAGES/DRESSINGS) ×1
GLOVE BIOGEL PI IND STRL 7.0 (GLOVE) IMPLANT
GLOVE BIOGEL PI IND STRL 7.5 (GLOVE) ×1 IMPLANT
GLOVE BIOGEL PI INDICATOR 7.0 (GLOVE)
GLOVE BIOGEL PI INDICATOR 7.5 (GLOVE) ×1
GLOVE SS BIOGEL STRL SZ 7 (GLOVE) ×1 IMPLANT
GLOVE SS BIOGEL STRL SZ 7.5 (GLOVE) IMPLANT
GLOVE SUPERSENSE BIOGEL SZ 7 (GLOVE) ×1
GLOVE SUPERSENSE BIOGEL SZ 7.5 (GLOVE)
GOWN STRL REUS W/ TWL LRG LVL3 (GOWN DISPOSABLE) ×2 IMPLANT
GOWN STRL REUS W/ TWL XL LVL3 (GOWN DISPOSABLE) ×1 IMPLANT
GOWN STRL REUS W/TWL LRG LVL3 (GOWN DISPOSABLE)
GOWN STRL REUS W/TWL XL LVL3 (GOWN DISPOSABLE)
NDL SAFETY ECLIPSE 18X1.5 (NEEDLE) ×1 IMPLANT
NDL SPNL 22GX3.5 QUINCKE BK (NEEDLE) IMPLANT
NEEDLE HYPO 18GX1.5 SHARP (NEEDLE) ×2
NEEDLE HYPO 22GX1.5 SAFETY (NEEDLE) ×2 IMPLANT
NEEDLE SPNL 22GX3.5 QUINCKE BK (NEEDLE) IMPLANT
PAD ALCOHOL SWAB (MISCELLANEOUS) ×4 IMPLANT
SLING ARM FOAM STRAP MED (SOFTGOODS) ×1 IMPLANT
SWABSTICK POVIDONE IODINE SNGL (MISCELLANEOUS) IMPLANT
SYR 20ML LL LF (SYRINGE) ×2 IMPLANT
SYR CONTROL 10ML LL (SYRINGE) ×1 IMPLANT

## 2019-02-23 NOTE — Op Note (Signed)
NAME: Jo Mitchell, Jo Mitchell MEDICAL RECORD [id number] ACCOUNT [id number] DATE OF BIRTH:[birth year] FACILITY: [facility] LOCATION: [facility] PHYSICIAN:Tyresse Jayson Venetia Maxon, MD  OPERATIVE REPORT  DATE OF PROCEDURE:  02/23/2019  PREOPERATIVE DIAGNOSES:   Right shoulder rotator cuff tendinitis with adhesive capsulitis.  POSTOPERATIVE DIAGNOSES:  Right shoulder rotator cuff tendinitis with adhesive capsulitis.  PROCEDURE:  Right shoulder examination under anesthesia followed by manipulation with cortisone injection.  SURGEON:  Elsie Saas, MD  ANESTHESIA:  General/MAC with a block.  OPERATIVE TIME:  5 minutes.  COMPLICATIONS:  None.  INDICATIONS FOR PROCEDURE:  The patient is a 43 year old woman who has had 3-4 months of increasing right shoulder pain with exam and MRI documenting a rotator cuff tendinitis.  She has failed multiple conservative modalities and is getting worse and is  now to undergo EUA manipulation and cortisone injection.  DESCRIPTION OF PROCEDURE:  The patient was brought to the operating room on 02/23/2019 after an interscalene block was placed in the holding room by anesthesia.  She was brought to the operating room and kept on her stretcher.  Under mask anesthesia, her  right shoulder was then inspected.  She had forward flexion of 150, abduction of 140, internal and external rotation of 60 degrees.  A gentle manipulation was carried out, breaking up adhesions and improving range of motion to full range of motion.   Shoulder remained stable.  The shoulder was injected with 80 mg of Depo-Medrol and 6 mL of Marcaine with epinephrine under sterile conditions.  She was then awakened and taken to recovery room in stable condition.  FOLLOWUP CARE:  She will be treated as an outpatient, on Norco for pain.  She will be back in the office in a week for recheck.  She will begin early aggressive physical therapy tomorrow.  LN/NUANCE  D:02/23/2019 T:02/23/2019 JOB:[id number]

## 2019-02-23 NOTE — Discharge Instructions (Signed)

## 2019-02-23 NOTE — Anesthesia Procedure Notes (Signed)
Anesthesia Regional Block: Interscalene brachial plexus block   Pre-Anesthetic Checklist: ,, timeout performed, Correct Patient, Correct Site, Correct Laterality, Correct Procedure, Correct Position, site marked, Risks and benefits discussed,  Surgical consent,  Pre-op evaluation,  At surgeon's request and post-op pain management  Laterality: Right  Prep: chloraprep       Needles:  Injection technique: Single-shot  Needle Type: Echogenic Stimulator Needle     Needle Length: 5cm  Needle Gauge: 22     Additional Needles:   Procedures:, nerve stimulator,,, ultrasound used (permanent image in chart),,,,   Nerve Stimulator or Paresthesia:  Response: hand, 0.45 mA,   Additional Responses:   Narrative:  Start time: 02/23/2019 8:48 AM End time: 02/23/2019 8:52 AM Injection made incrementally with aspirations every 5 mL.  Performed by: Personally  Anesthesiologist: Janeece Riggers, MD  Additional Notes: Functioning IV was confirmed and monitors were applied.  A 70mm 22ga Arrow echogenic stimulator needle was used. Sterile prep and drape,hand hygiene and sterile gloves were used. Ultrasound guidance: relevant anatomy identified, needle position confirmed, local anesthetic spread visualized around nerve(s)., vascular puncture avoided.  Image printed for medical record. Negative aspiration and negative test dose prior to incremental administration of local anesthetic. The patient tolerated the procedure well.

## 2019-02-23 NOTE — Anesthesia Postprocedure Evaluation (Signed)
Anesthesia Post Note  Patient: Jo Mitchell  Procedure(s) Performed: SHOULDER INJECTION (Right Shoulder) CLOSED MANIPULATION SHOULDER (Right Shoulder)     Patient location during evaluation: PACU Anesthesia Type: General Level of consciousness: awake and alert Pain management: pain level controlled Vital Signs Assessment: post-procedure vital signs reviewed and stable Respiratory status: spontaneous breathing, nonlabored ventilation, respiratory function stable and patient connected to nasal cannula oxygen Cardiovascular status: blood pressure returned to baseline and stable Postop Assessment: no apparent nausea or vomiting Anesthetic complications: no    Last Vitals:  Vitals:   02/23/19 1010 02/23/19 1036  BP:  100/61  Pulse: (!) 55 60  Resp: 18 18  Temp:  36.7 C  SpO2: 100% 100%    Last Pain:  Vitals:   02/23/19 1036  TempSrc:   PainSc: 0-No pain                 Arnelle Nale

## 2019-02-23 NOTE — Transfer of Care (Signed)
Immediate Anesthesia Transfer of Care Note  Patient: Jo Mitchell  Procedure(s) Performed: SHOULDER INJECTION (Right Shoulder) CLOSED MANIPULATION SHOULDER (Right Shoulder)  Patient Location: PACU  Anesthesia Type:MAC  Level of Consciousness: awake  Airway & Oxygen Therapy: Patient Spontanous Breathing and Patient connected to nasal cannula oxygen  Post-op Assessment: Report given to RN and Post -op Vital signs reviewed and stable  Post vital signs: Reviewed and stable  Last Vitals:  Vitals Value Taken Time  BP    Temp    Pulse    Resp    SpO2      Last Pain:  Vitals:   02/23/19 0809  TempSrc: Oral  PainSc: 0-No pain         Complications: No apparent anesthesia complications

## 2019-02-23 NOTE — Interval H&P Note (Signed)
History and Physical Interval Note:  02/23/2019 9:00 AM  Jo Mitchell  has presented today for surgery, with the diagnosis of CAPSULITIS OF RIGHT SHOULDER,OSTEOARTHRITIS.  The various methods of treatment have been discussed with the patient and family. After consideration of risks, benefits and other options for treatment, the patient has consented to  Procedure(s): SHOULDER INJECTION (Right) CLOSED MANIPULATION SHOULDER (Right) as a surgical intervention.  The patient's history has been reviewed, patient examined, no change in status, stable for surgery.  I have reviewed the patient's chart and labs.  Questions were answered to the patient's satisfaction.     Lorn Junes

## 2019-02-23 NOTE — Anesthesia Preprocedure Evaluation (Signed)
Anesthesia Evaluation  Patient identified by MRN, date of birth, ID band Patient awake    Reviewed: Allergy & Precautions, H&P , NPO status , Patient's Chart, lab work & pertinent test results, reviewed documented beta blocker date and time   Airway Mallampati: I  TM Distance: >3 FB Neck ROM: Full    Dental no notable dental hx. (+) Teeth Intact, Dental Advisory Given   Pulmonary neg pulmonary ROS,    Pulmonary exam normal breath sounds clear to auscultation       Cardiovascular Exercise Tolerance: Good negative cardio ROS Normal cardiovascular exam Rhythm:Regular Rate:Normal     Neuro/Psych negative neurological ROS  negative psych ROS   GI/Hepatic negative GI ROS, Neg liver ROS, GERD  Medicated and Controlled,  Endo/Other  negative endocrine ROSHypothyroidism   Renal/GU negative Renal ROS  negative genitourinary   Musculoskeletal   Abdominal   Peds  Hematology negative hematology ROS (+)   Anesthesia Other Findings   Reproductive/Obstetrics negative OB ROS                             Anesthesia Physical  Anesthesia Plan  ASA: II  Anesthesia Plan: General   Post-op Pain Management: GA combined w/ Regional for post-op pain   Induction: Intravenous  PONV Risk Score and Plan: 2 and Ondansetron  Airway Management Planned: LMA and Oral ETT  Additional Equipment:   Intra-op Plan:   Post-operative Plan: Extubation in OR  Informed Consent: I have reviewed the patients History and Physical, chart, labs and discussed the procedure including the risks, benefits and alternatives for the proposed anesthesia with the patient or authorized representative who has indicated his/her understanding and acceptance.     Dental Advisory Given  Plan Discussed with: CRNA, Surgeon and Anesthesiologist  Anesthesia Plan Comments: (Discussed both nerve block for pain relief post-op and GA;  including NV, sore throat, dental injury, and pulmonary complications)        Anesthesia Quick Evaluation

## 2019-02-23 NOTE — Anesthesia Procedure Notes (Signed)
Procedure Name: MAC Date/Time: 02/23/2019 9:27 AM Performed by: Marrianne Mood, CRNA Pre-anesthesia Checklist: Patient identified, Emergency Drugs available, Suction available and Patient being monitored Patient Re-evaluated:Patient Re-evaluated prior to induction Oxygen Delivery Method: Simple face mask Preoxygenation: Pre-oxygenation with 100% oxygen

## 2019-02-23 NOTE — Progress Notes (Signed)
Assisted Dr. Oddono with right, ultrasound guided, interscalene  block. Side rails up, monitors on throughout procedure. See vital signs in flow sheet. Tolerated Procedure well. 

## 2019-02-24 ENCOUNTER — Encounter (HOSPITAL_BASED_OUTPATIENT_CLINIC_OR_DEPARTMENT_OTHER): Payer: Self-pay | Admitting: Orthopedic Surgery

## 2019-03-10 ENCOUNTER — Other Ambulatory Visit: Payer: Self-pay | Admitting: Family Medicine

## 2019-03-10 DIAGNOSIS — M79622 Pain in left upper arm: Secondary | ICD-10-CM

## 2019-03-17 ENCOUNTER — Ambulatory Visit
Admission: RE | Admit: 2019-03-17 | Discharge: 2019-03-17 | Disposition: A | Discharge status: Home / Self Care | Payer: BC Managed Care – PPO | Source: Ambulatory Visit | Attending: Family Medicine | Admitting: Family Medicine

## 2019-03-17 ENCOUNTER — Other Ambulatory Visit: Payer: Self-pay

## 2019-03-17 DIAGNOSIS — M79622 Pain in left upper arm: Secondary | ICD-10-CM

## 2019-07-30 ENCOUNTER — Ambulatory Visit: Payer: BC Managed Care – PPO | Attending: Family

## 2019-07-30 DIAGNOSIS — Z23 Encounter for immunization: Secondary | ICD-10-CM

## 2019-07-30 NOTE — Progress Notes (Signed)
   Covid-19 Vaccination Clinic  Name:  Jo Mitchell    MRN: [id number] DOB: [birth year]  07/30/2019  Ms. Jo Mitchell was observed post Covid-19 immunization for 15 minutes without incident. She was provided with Vaccine Information Sheet and instruction to access the V-Safe system.   Ms. Jo Mitchell was instructed to call 911 with any severe reactions post vaccine: Marland Kitchen Difficulty breathing  . Swelling of face and throat  . A fast heartbeat  . A bad rash all over body  . Dizziness and weakness   Immunizations Administered    Name Date Dose VIS Date Route   Moderna COVID-19 Vaccine 07/30/2019  2:29 PM 0.5 mL 04/21/2019 Intramuscular   Manufacturer: Moderna   Lot: [id number]   NDC: 49675-916-38

## 2019-08-27 ENCOUNTER — Ambulatory Visit: Payer: BC Managed Care – PPO | Attending: Family

## 2019-08-27 DIAGNOSIS — Z23 Encounter for immunization: Secondary | ICD-10-CM

## 2019-08-27 NOTE — Progress Notes (Signed)
   Covid-19 Vaccination Clinic  Name:  RACHYL WUEBKER    MRN: [id number] DOB: 06-06-1975  08/27/2019  Ms. Debord was observed post Covid-19 immunization for 15 minutes without incident. She was provided with Vaccine Information Sheet and instruction to access the V-Safe system.   Ms. Montag was instructed to call 911 with any severe reactions post vaccine: Marland Kitchen Difficulty breathing  . Swelling of face and throat  . A fast heartbeat  . A bad rash all over body  . Dizziness and weakness   Immunizations Administered    Name Date Dose VIS Date Route   Moderna COVID-19 Vaccine 08/27/2019 10:54 AM 0.5 mL 04/21/2019 Intramuscular   Manufacturer: Moderna   Lot: [id number]   NDC: 92909-030-14

## 2019-09-01 ENCOUNTER — Ambulatory Visit: Payer: BC Managed Care – PPO

## 2019-11-02 ENCOUNTER — Encounter (HOSPITAL_BASED_OUTPATIENT_CLINIC_OR_DEPARTMENT_OTHER): Payer: Self-pay

## 2019-11-02 ENCOUNTER — Emergency Department (HOSPITAL_BASED_OUTPATIENT_CLINIC_OR_DEPARTMENT_OTHER)
Admission: EM | Admit: 2019-11-02 | Discharge: 2019-11-02 | Disposition: A | Discharge status: Home / Self Care | Payer: BC Managed Care – PPO | Attending: Emergency Medicine | Admitting: Emergency Medicine

## 2019-11-02 ENCOUNTER — Other Ambulatory Visit: Payer: Self-pay

## 2019-11-02 ENCOUNTER — Emergency Department (HOSPITAL_BASED_OUTPATIENT_CLINIC_OR_DEPARTMENT_OTHER): Payer: BC Managed Care – PPO

## 2019-11-02 DIAGNOSIS — E039 Hypothyroidism, unspecified: Secondary | ICD-10-CM | POA: Insufficient documentation

## 2019-11-02 DIAGNOSIS — Z86718 Personal history of other venous thrombosis and embolism: Secondary | ICD-10-CM | POA: Insufficient documentation

## 2019-11-02 DIAGNOSIS — M79602 Pain in left arm: Secondary | ICD-10-CM

## 2019-11-02 DIAGNOSIS — M79622 Pain in left upper arm: Secondary | ICD-10-CM | POA: Insufficient documentation

## 2019-11-02 LAB — CBC WITH DIFFERENTIAL/PLATELET
Abs Immature Granulocytes: 0.02 10*3/uL (ref 0.00–0.07)
Basophils Absolute: 0 10*3/uL (ref 0.0–0.1)
Basophils Relative: 1 %
Eosinophils Absolute: 0.1 10*3/uL (ref 0.0–0.5)
Eosinophils Relative: 2 %
HCT: 41.2 % (ref 36.0–46.0)
Hemoglobin: 13.5 g/dL (ref 12.0–15.0)
Immature Granulocytes: 0 %
Lymphocytes Relative: 27 %
Lymphs Abs: 1.6 10*3/uL (ref 0.7–4.0)
MCH: 28.3 pg (ref 26.0–34.0)
MCHC: 32.8 g/dL (ref 30.0–36.0)
MCV: 86.4 fL (ref 80.0–100.0)
Monocytes Absolute: 0.5 10*3/uL (ref 0.1–1.0)
Monocytes Relative: 8 %
Neutro Abs: 3.7 10*3/uL (ref 1.7–7.7)
Neutrophils Relative %: 62 %
Platelets: 293 10*3/uL (ref 150–400)
RBC: 4.77 MIL/uL (ref 3.87–5.11)
RDW: 13 % (ref 11.5–15.5)
WBC: 5.9 10*3/uL (ref 4.0–10.5)
nRBC: 0 % (ref 0.0–0.2)

## 2019-11-02 LAB — BASIC METABOLIC PANEL
Anion gap: 9 (ref 5–15)
BUN: 11 mg/dL (ref 6–20)
CO2: 24 mmol/L (ref 22–32)
Calcium: 8.9 mg/dL (ref 8.9–10.3)
Chloride: 103 mmol/L (ref 98–111)
Creatinine, Ser: 0.85 mg/dL (ref 0.44–1.00)
GFR calc Af Amer: >60 mL/min (ref >60)
GFR calc non Af Amer: >60 mL/min (ref >60)
Glucose, Bld: 104 mg/dL — ABNORMAL HIGH (ref 70–99)
Potassium: 3.7 mmol/L (ref 3.5–5.1)
Sodium: 136 mmol/L (ref 135–145)

## 2019-11-02 NOTE — ED Provider Notes (Signed)
[facility] EMERGENCY DEPARTMENT Provider Note   CSN: [id number] Arrival date & time: 11/02/19  1408     History Chief Complaint  Patient presents with  . Arm Pain    Jo Mitchell is a 44 y.o. female.  The history is provided by the patient. No language interpreter was used.  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  Patient reports 3 days ago she developed pain to her left upper extremity.  She described pain as a tightness sensation to her left shoulder and her left elbow.  2 days later she felt some knots to her upper arm.  Pain has improved but she was concerned.  She does not complain of any fever chills no chest pain or shortness of breath no neck pain no numbness or weakness no rash.  She denies any strenuous activities or heavy lifting.  She has had DVT of her left lower extremity a year ago after she had an left knee ACL injury.  She denies any prior blood clots.  Denies any recent travel, taking birth control pill, recent surgery, prolonged bedrest, or active cancer.  She did reach out to her PCP who recommend patient to go to ER or to obtain DVT study as well as basic labs.  Past Medical History:  Diagnosis Date  . DVT (deep venous thrombosis) (HCC) 05/25/2018   eliquis x 3 mos  . Endometrial mass   . HA (headache)    occassional  . Heart palpitations    hx  caffeine related, cut back . - no current problems  . History of hypothyroidism    postpartum 2005  -- resolved after 6 months w/ meds. resolved  . Hypothyroidism    during pregnancy only  . Menorrhagia   . Mild acid reflux    occasional - med prn  . Myoclonus   . Palpitations 12/31/2018  . Thyroid disease    during her pregnancy only 2005  . Vertigo   . Vitamin D deficiency   . Wears glasses     Patient Active Problem List   Diagnosis Date Noted  . 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 Palpitations  12/31/2018  . 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 ANAL FISTULOTOMY N/A 10/31/2016   Procedure: ANAL FISTULOTOMY;  Surgeon: Romie Levee, MD;  Location: [facility];  Service: General;  Laterality: N/A;  . CESAREAN SECTION  11-25-2003  . DILATATION & CURRETTAGE/HYSTEROSCOPY WITH RESECTOCOPE N/A 06/12/2013   Procedure: DILATATION & CURETTAGE/HYSTEROSCOPY WITH RESECTOCOPE;  Surgeon: Serita Kyle, MD;  Location: [facility];  Service: Gynecology;  Laterality: N/A;  . EVALUATION UNDER ANESTHESIA WITH ANAL FISTULECTOMY N/A 04/09/2013   Procedure: EXAM UNDER ANESTHESIA , FISTULOTOMY;  Surgeon: Romie Levee, MD;  Location: [facility];  Service: General;  Laterality: N/A;  . INCISION AND DRAINAGE PERIRECTAL ABSCESS N/A 03/15/2013   Procedure: IRRIGATION AND DEBRIDEMENT PERIRECTAL ABSCESS;  Surgeon: Lodema Pilot, DO;  Location: [facility];  Service: General;  Laterality: N/A;  . INGUINAL HERNIA REPAIR Left 1999  . RECTAL EXAM UNDER ANESTHESIA N/A 10/31/2016   Procedure: ANAL EXAM UNDER ANESTHESIA;  Surgeon: Romie Levee, MD;  Location: [facility];  Service: General;  Laterality: N/A;  . SHOULDER CLOSED REDUCTION Right 02/23/2019   Procedure: CLOSED MANIPULATION SHOULDER;  Surgeon: Salvatore Marvel, MD;  Location: [facility];  Service: Orthopedics;  Laterality: Right;  . SHOULDER INJECTION Right 02/23/2019   Procedure: SHOULDER INJECTION;  Surgeon: Salvatore Marvel, MD;  Location: [facility];  Service: Orthopedics;  Laterality: Right;     OB History   No obstetric history on file.     No family history on file.  Social History   Tobacco Use  . Smoking status: Never Smoker  . Smokeless tobacco: Never Used  Vaping Use  . Vaping Use: Never used  Substance Use Topics  . Alcohol use: No  . Drug use: No    Home Medications Prior to Admission medications   Medication Sig Start  Date End Date Taking? Authorizing Provider  cholecalciferol (VITAMIN D3) 25 MCG (1000 UT) tablet Take 1,000 Units by mouth 2 (two) times daily.    [provider]  HYDROcodone-acetaminophen (NORCO/VICODIN) 5-325 MG tablet Take 1 tablet by mouth every 4 (four) hours as needed for moderate pain. 02/23/19 02/23/20  Shepperson, Kirstin, PA-C    Allergies    Amoxicillin, Azithromycin, Dexilant [dexlansoprazole], Lodine [etodolac], and Nsaids  Review of Systems   Review of Systems  All other systems reviewed and are negative.   Physical Exam Updated Vital Signs BP 131/78 (BP Location: Right Arm)   Pulse 72   Temp 98.9 F (37.2 C) (Oral)   Resp 18   Ht 5\' 4"  (1.626 m)   Wt 68 kg   LMP 10/14/2019   SpO2 100%   BMI 25.75 kg/m   Physical Exam Vitals and nursing note reviewed.  Constitutional:      General: She is not in acute distress.    Appearance: She is well-developed.  HENT:     Head: Atraumatic.  Eyes:     Conjunctiva/sclera: Conjunctivae normal.  Cardiovascular:     Rate and Rhythm: Normal rate and regular rhythm.     Pulses: Normal pulses.     Heart sounds: Normal heart sounds.  Pulmonary:     Effort: Pulmonary effort is normal.     Breath sounds: Normal breath sounds. No wheezing, rhonchi or rales.  Abdominal:     Palpations: Abdomen is soft.     Tenderness: There is no abdominal tenderness.  Musculoskeletal:        General: No swelling.     Cervical back: Neck supple.     Comments: Left upper extremity: No significant edema erythema or warmth noted to upper extremity.  No palpable nodule appreciated.  Full range of motion throughout left shoulder and left elbow and left wrist.  Radial pulse 2+.  Arm compartments soft.  Skin:    Findings: No rash.  Neurological:     Mental Status: She is alert and oriented to person, place, and time.     ED Results / Procedures / Treatments   Labs (all labs ordered are listed, but only abnormal results are  displayed) Labs Reviewed - No data to display  EKG None  Radiology 10/16/2019 Venous Img Upper Left (DVT Study)  Result Date: 11/02/2019 CLINICAL DATA:  Left arm knots, pain EXAM: LEFT UPPER EXTREMITY VENOUS DOPPLER ULTRASOUND TECHNIQUE: Gray-scale sonography with graded compression, as well as color Doppler and duplex ultrasound were performed to evaluate the upper extremity deep venous system from the level of the subclavian vein and including the jugular, axillary, basilic, radial, ulnar and upper cephalic vein. Spectral Doppler was utilized to evaluate flow at rest and with distal augmentation maneuvers. COMPARISON:  None. FINDINGS: Contralateral IJ and subclavian Vein: Respiratory phasicity is normal and  symmetric with the symptomatic side. No evidence of thrombus. Normal compressibility. Internal Jugular Vein: No evidence of thrombus. Normal compressibility, respiratory phasicity and response to augmentation. Subclavian Vein: No evidence of thrombus. Normal compressibility, respiratory phasicity and response to augmentation. Axillary Vein: No evidence of thrombus. Normal compressibility, respiratory phasicity and response to augmentation. Cephalic Vein: No evidence of thrombus. Normal compressibility, respiratory phasicity and response to augmentation. Basilic Vein: No evidence of thrombus. Normal compressibility, respiratory phasicity and response to augmentation. Brachial Veins: No evidence of thrombus. Normal compressibility, respiratory phasicity and response to augmentation. Radial Veins: No evidence of thrombus. Normal compressibility, respiratory phasicity and response to augmentation. Ulnar Veins: No evidence of thrombus. Normal compressibility, respiratory phasicity and response to augmentation. Venous Reflux:  None visualized. Other Findings:  None visualized. IMPRESSION: No evidence of DVT within the left upper extremity. Electronically Signed   By: Lucrezia Europe M.D.   On: 11/02/2019 15:57     Procedures Procedures (including critical care time)  Medications Ordered in ED Medications - No data to display  ED Course  I have reviewed the triage vital signs and the nursing notes.  Pertinent labs & imaging results that were available during my care of the patient were reviewed by me and considered in my medical decision making (see chart for details).    MDM Rules/Calculators/A&P                          BP 131/78 (BP Location: Right Arm)   Pulse 72   Temp 98.9 F (37.2 C) (Oral)   Resp 18   Ht 5\' 4"  (1.626 m)   Wt 68 kg   LMP 10/14/2019   SpO2 100%   BMI 25.75 kg/m   Final Clinical Impression(s) / ED Diagnoses Final diagnoses:  Left arm pain    Rx / DC Orders ED Discharge Orders    None     2:38 PM Patient here with concerns of potential DVT of her left upper extremity when she developed atraumatic pain for the past 3 days.  Examination of left upper extremity without any concerning feature.  She has had DVT to her left lower extremity in the past therefore will obtain venous Doppler ultrasound to rule out DVT.  No evidence of infection.  Low suspicion for fracture or dislocation.  4:10 PM Venous US of LUE without evidence of DVT.  Pt was reassured.  Labs are reassuring.  Stable for discharge.    Domenic Moras, PA-C 11/02/19 1616    Virgel Manifold, MD 11/03/19 [phone]

## 2019-11-02 NOTE — Discharge Instructions (Addendum)
You have been evaluated for left upper arm discomfort.  Fortunately an ultrasound performed today show no evidence of any blood clot.  Please follow-up with your doctor as needed for further evaluation. Take tylenol or ibuprofen as needed for pain.

## 2019-11-02 NOTE — ED Notes (Signed)
Pt in Ultrasound

## 2019-11-02 NOTE — ED Triage Notes (Signed)
Pt arrives with c/o swollen area to left upper inner arm states that she has history of blood clots, last Covid shot was in April.

## 2020-01-07 IMAGING — DX DG CHEST 2V
2 series · 2 of 2 positions shown · non-contrast
Comparison: 08/05/2015

CLINICAL DATA: Chest tightness

EXAM:
CHEST - 2 VIEW

[w chest pa]
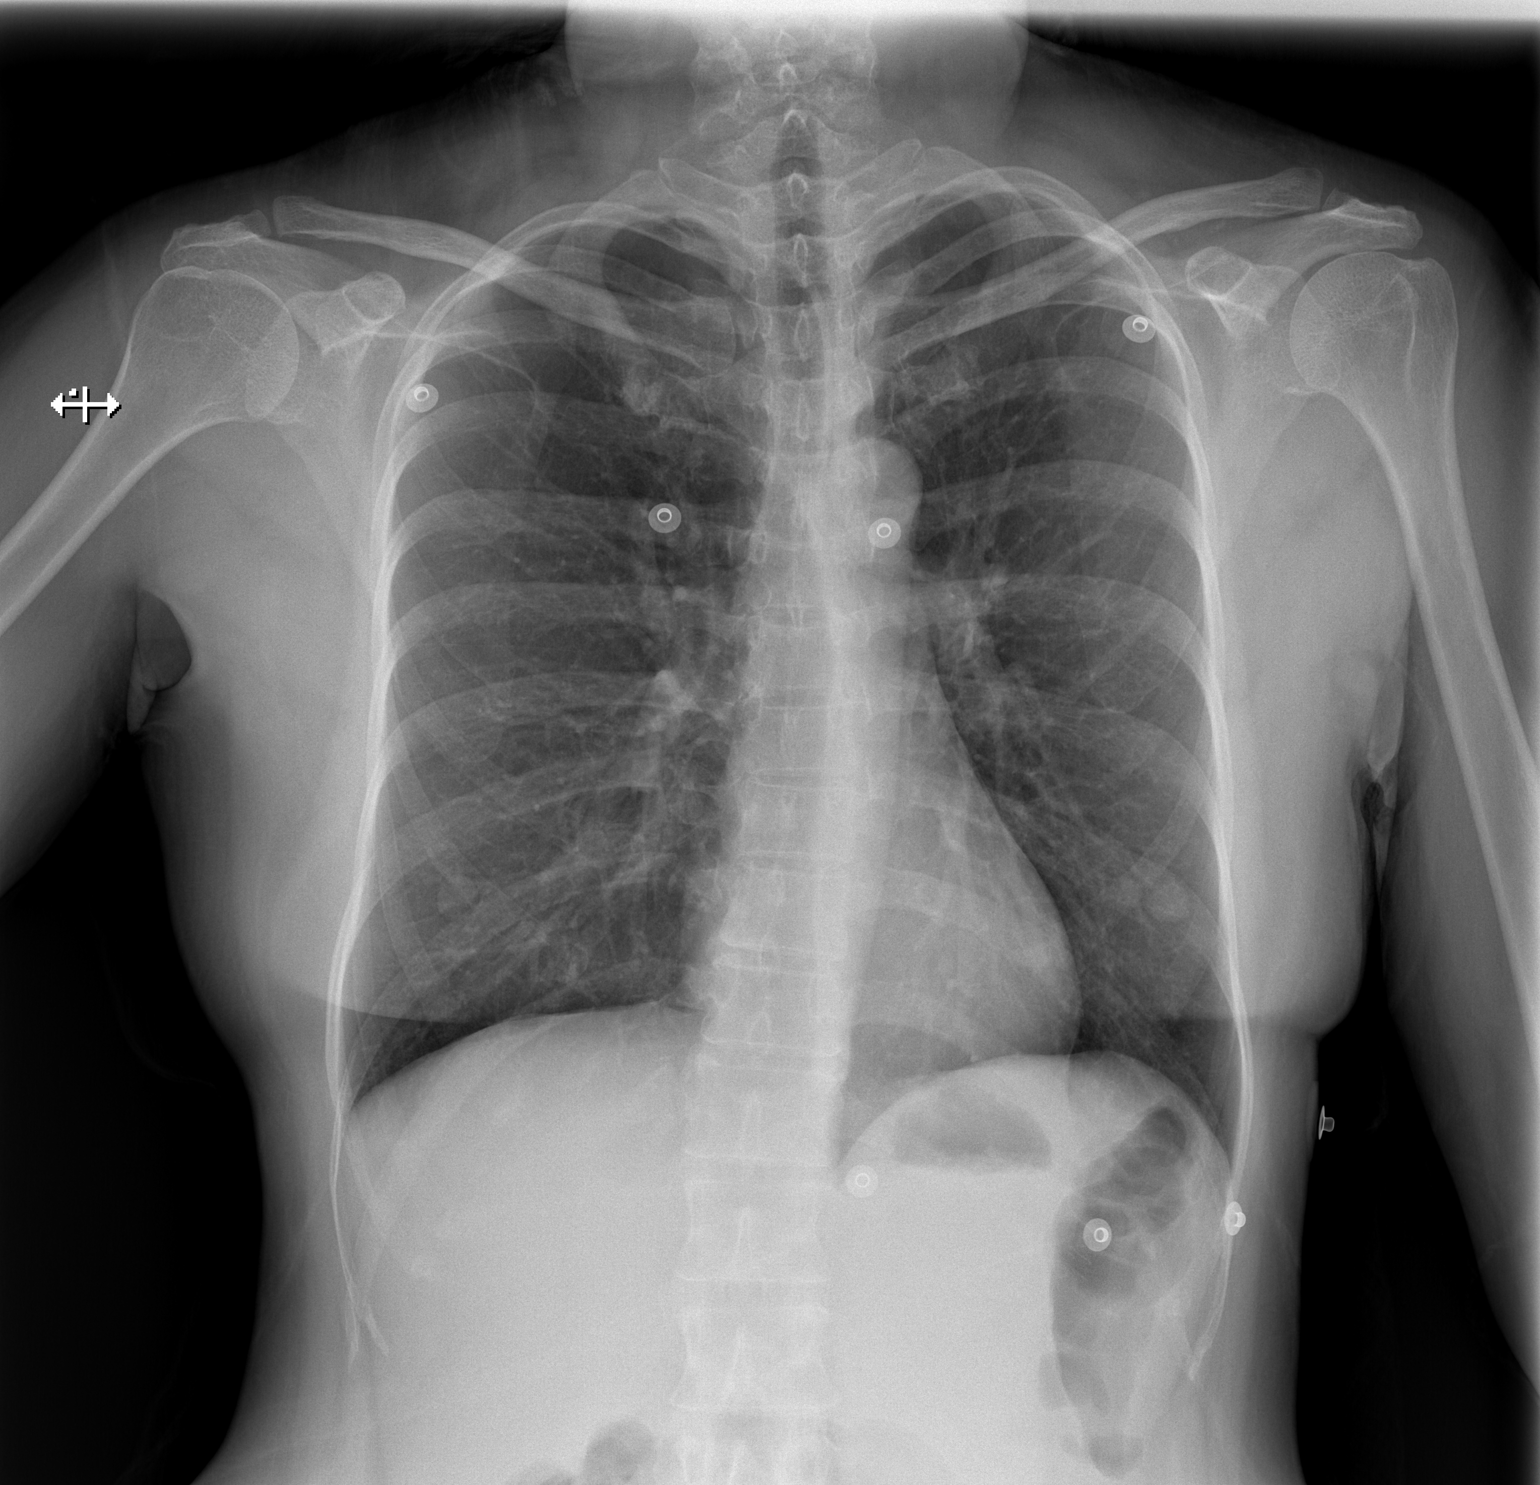

[w chest lat]
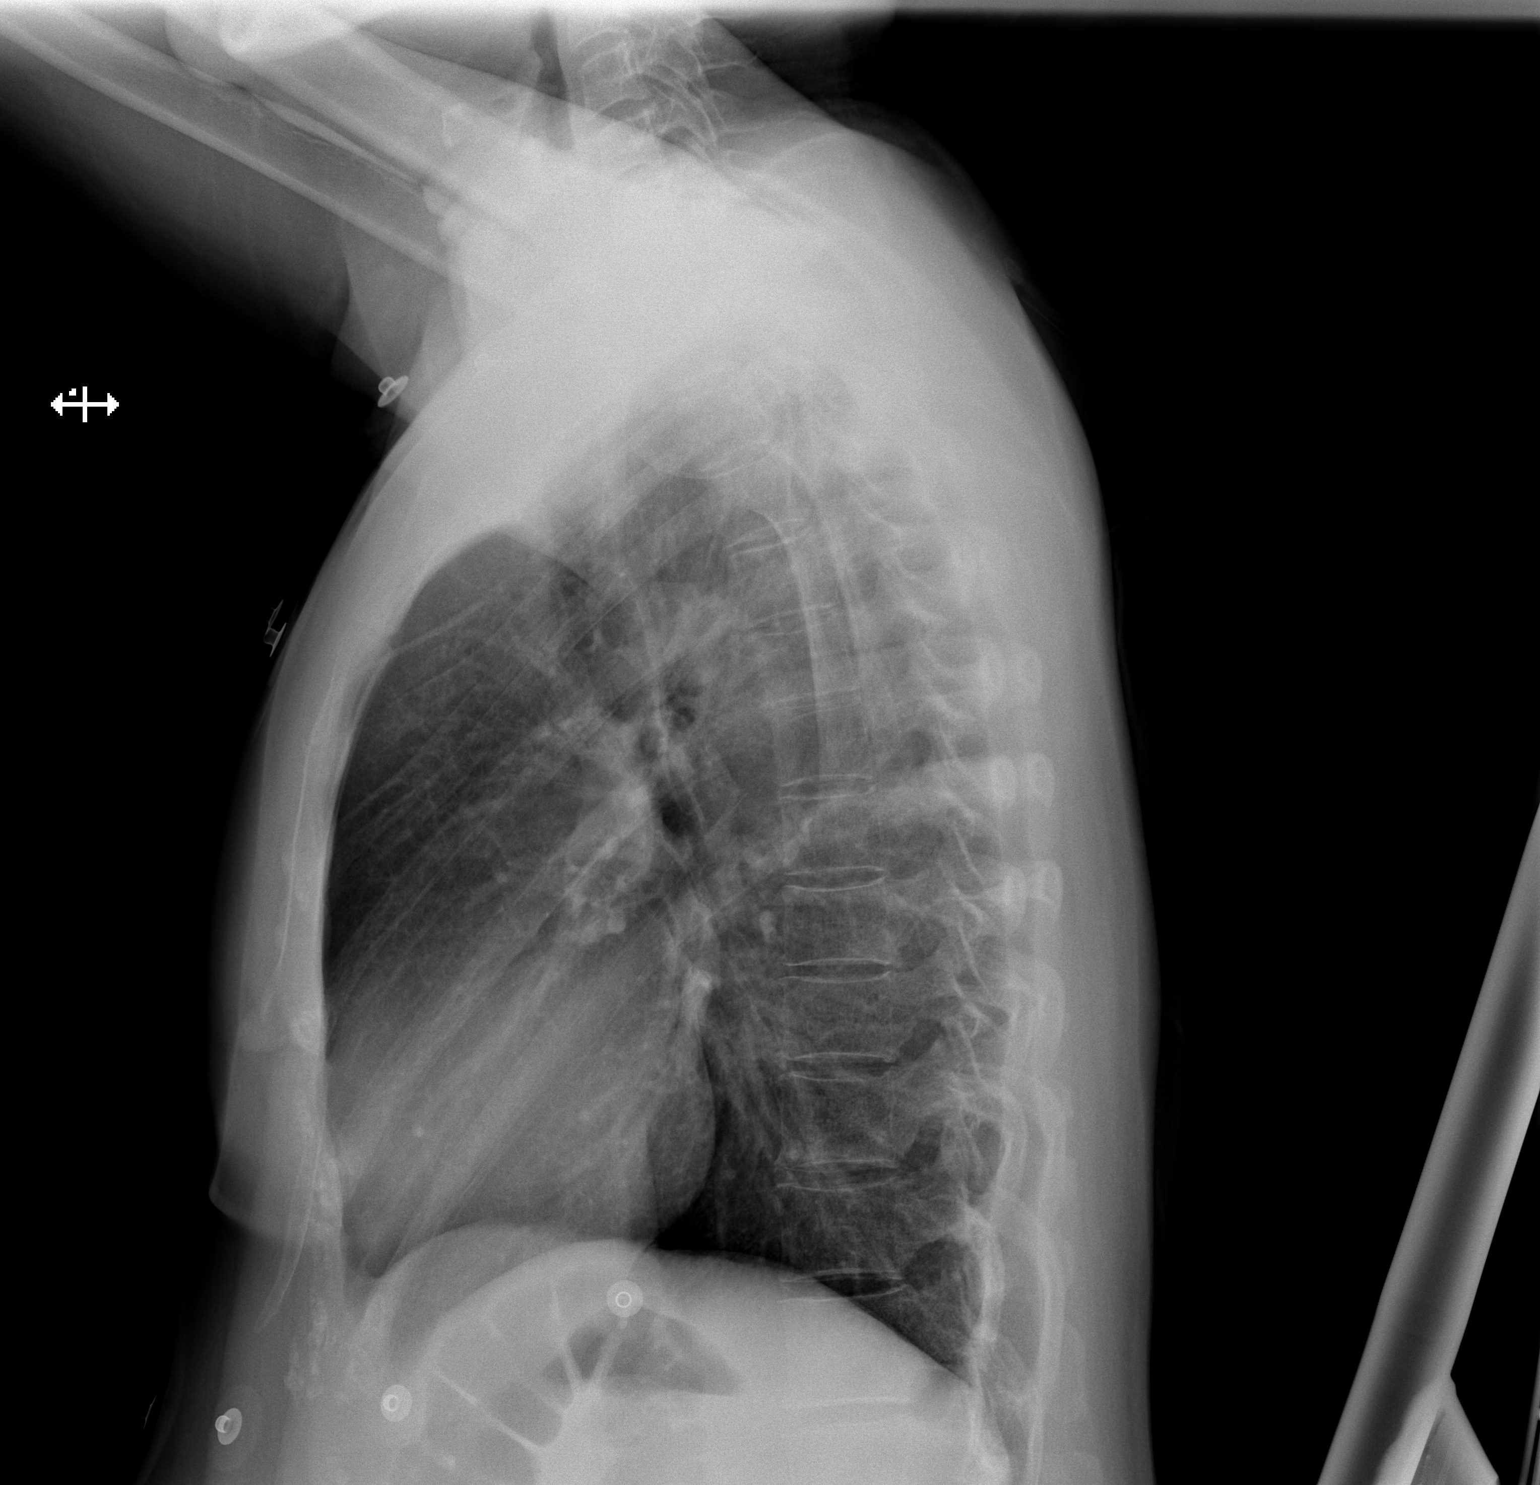

[2 of 2 positions shown; findings below may reference images not displayed]

FINDINGS: Heart and mediastinal contours are within normal limits. No focal
opacities or effusions. No acute bony abnormality.
IMPRESSION: No active cardiopulmonary disease.

## 2020-03-15 ENCOUNTER — Other Ambulatory Visit: Payer: Self-pay | Admitting: Family Medicine

## 2020-03-15 DIAGNOSIS — R208 Other disturbances of skin sensation: Secondary | ICD-10-CM

## 2020-04-08 ENCOUNTER — Ambulatory Visit
Admission: RE | Admit: 2020-04-08 | Discharge: 2020-04-08 | Disposition: A | Discharge status: Home / Self Care | Payer: BC Managed Care – PPO | Source: Ambulatory Visit | Attending: Family Medicine | Admitting: Family Medicine

## 2020-04-08 ENCOUNTER — Other Ambulatory Visit: Payer: Self-pay

## 2020-04-08 DIAGNOSIS — R208 Other disturbances of skin sensation: Secondary | ICD-10-CM

## 2020-08-04 ENCOUNTER — Other Ambulatory Visit: Payer: Self-pay

## 2020-08-04 ENCOUNTER — Ambulatory Visit (INDEPENDENT_AMBULATORY_CARE_PROVIDER_SITE_OTHER): Payer: BC Managed Care – PPO | Admitting: Sports Medicine

## 2020-08-04 VITALS — BP 102/70 | Ht 64.0 in | Wt 145.0 lb

## 2020-08-04 DIAGNOSIS — M2141 Flat foot [pes planus] (acquired), right foot: Secondary | ICD-10-CM

## 2020-08-04 DIAGNOSIS — M2142 Flat foot [pes planus] (acquired), left foot: Secondary | ICD-10-CM | POA: Diagnosis not present

## 2020-08-04 NOTE — Progress Notes (Signed)
   Subjective:    Patient ID: Jo Mitchell, female    DOB: [birth year], 45 y.o.   MRN: [id number]  HPI chief complaint: Possible orthotics  Very pleasant 45 year old female comes in today to discuss custom orthotics.  She is currently in physical therapy for bilateral knee pain which sounds patellofemoral in origin.  She is under the care of Dr. Thurston Hole for this.  The physical therapist recommended seeing [place] for possible new orthotics.  She has worn orthotics for several years.  Her most recent pair were made a couple of years ago and she finds them to be comfortable.  She was thinking about maybe getting a second pair for another pair of shoes.  Past medical history reviewed Medications reviewed Allergies reviewed    Review of Systems    As above Objective:   Physical Exam  Well-developed, well-nourished.  No acute distress  Examination of her feet in standing position shows pes planus bilaterally.  There is some collapse of the transverse arches bilaterally as well.  Bilateral calcaneal valgus, left greater than right.  No soft tissue swelling.  Full ankle range of motion.  Evaluation of her gait with her current orthotics in place shows a neutral gait      Assessment & Plan:   Pes planus Bilateral knee pain  Patient's current orthotics are adequate.  She may want to return to the office at a later date for a new pair but she will first check with her insurance company regarding cost.  I encouraged her to continue with physical therapy for her bilateral knee pain and to keep her follow-up appointment with Dr. Thurston Hole in April.

## 2020-10-05 IMAGING — MG DIGITAL DIAGNOSTIC BILAT W/ TOMO
8 series · 9 of 24 positions shown · non-contrast
Comparison: Previous exam(s).

CLINICAL DATA: Pain in the left axilla, radiating into the left
breast

EXAM:
DIGITAL DIAGNOSTIC BILATERAL MAMMOGRAM WITH CAD AND TOMO
ULTRASOUND LEFT BREAST

[L MLO synth-2D]
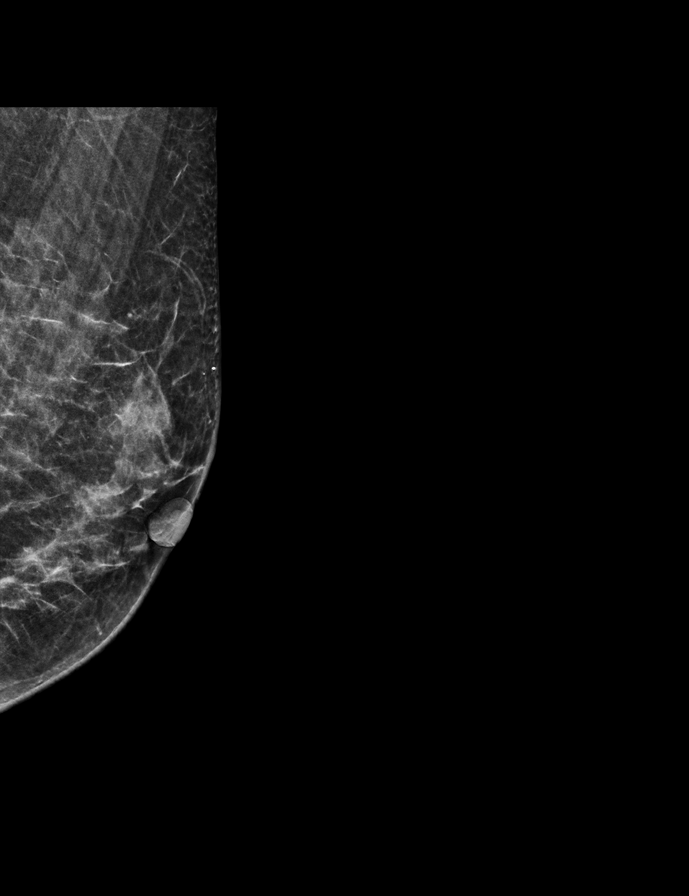

[R CC synth-2D]
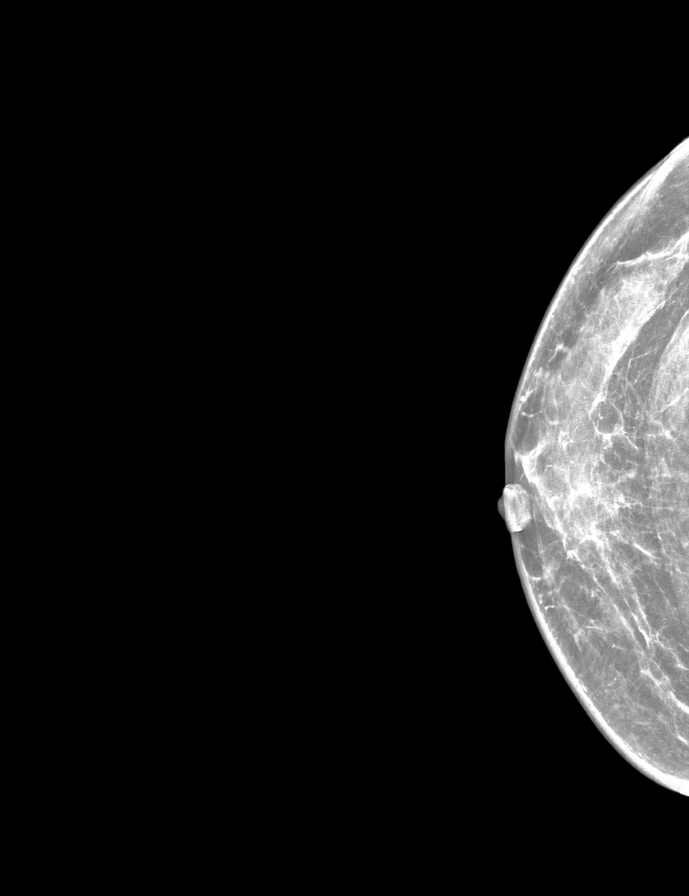

[R MLO synth-2D]
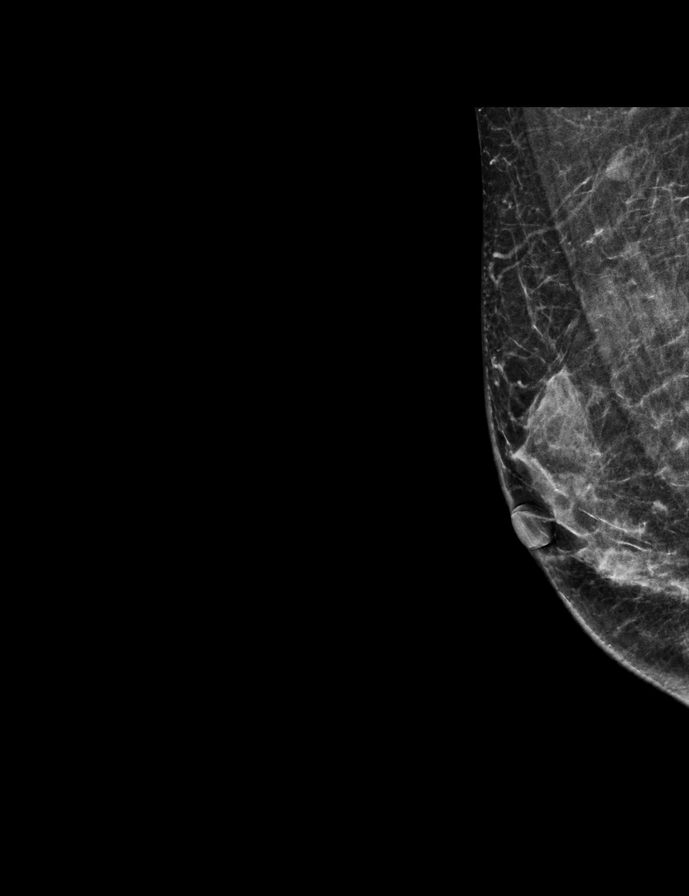

[L CC synth-2D]
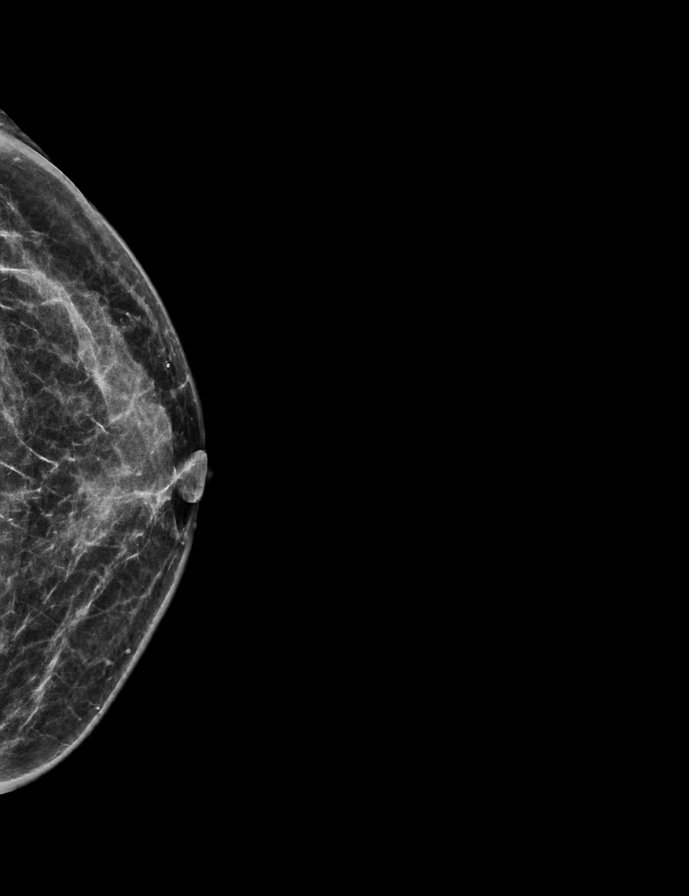

[R CC tomo · 2 of 50 frames shown]
[frame 17/50]
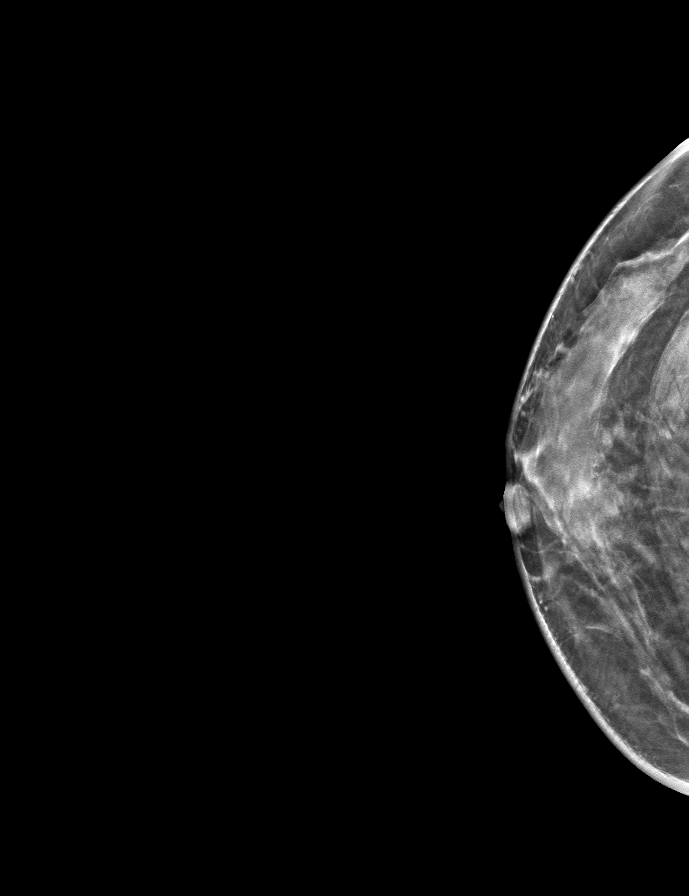
[frame 25/50]
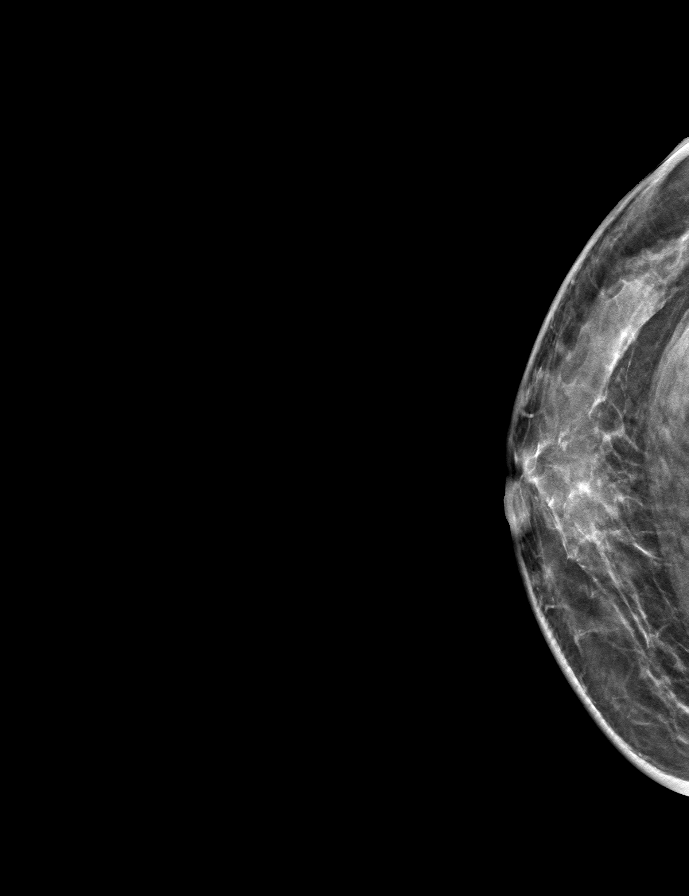

[L MLO tomo · tomo slice 24/47.0]
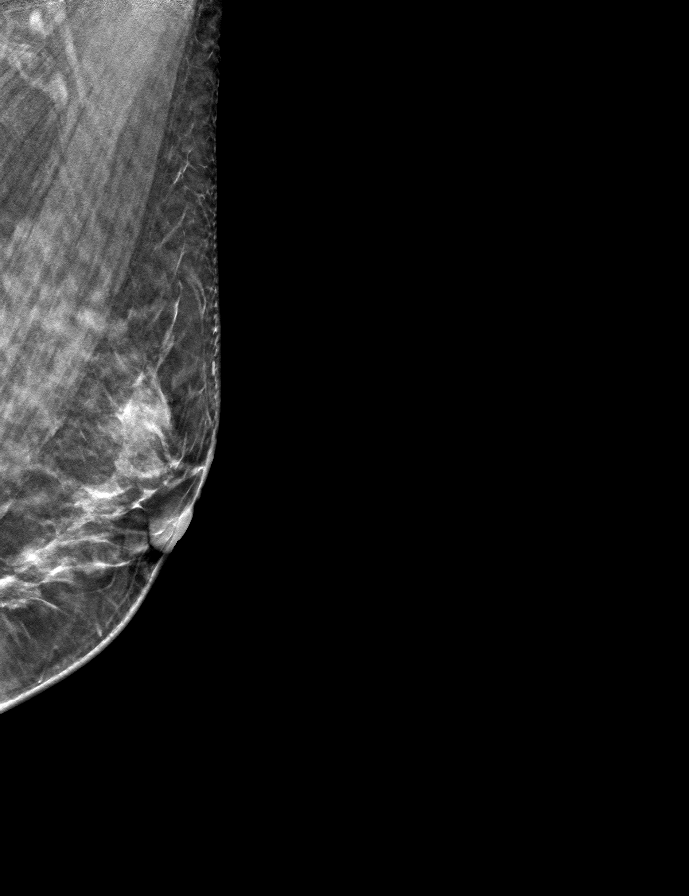

[R MLO tomo · tomo slice 24/47.0]
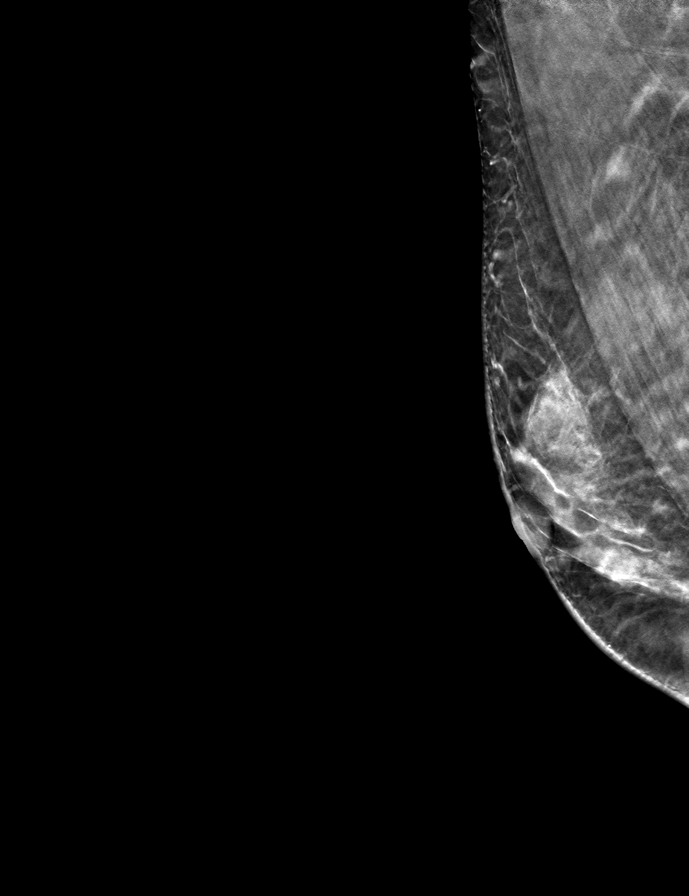

[L CC tomo · tomo slice 25/48.0]
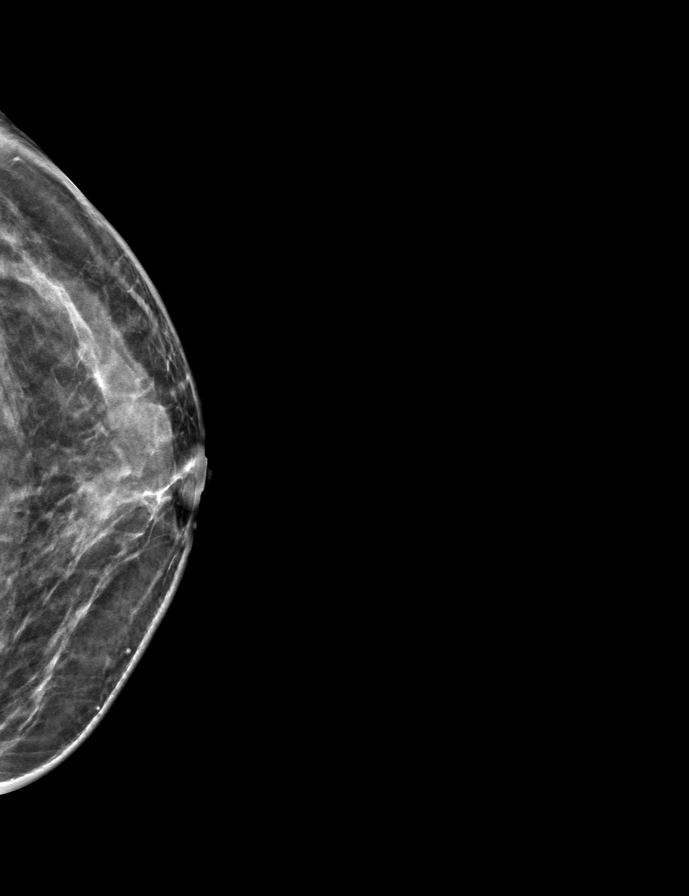

[9 of 24 positions shown; findings below may reference images not displayed]

ACR Breast Density Category c: The breast tissue is heterogeneously
dense, which may obscure small masses.
FINDINGS: No suspicious masses, calcifications, or distortion in either
breast.

Mammographic images were processed with CAD.

On physical exam, no suspicious lumps are identified.

Targeted ultrasound is performed, showing no sonographic
abnormalities.
IMPRESSION: No mammographic or sonographic evidence malignancy.

RECOMMENDATION:
Treatment of the patient's symptoms should be based on clinical and
physical exam given lack of imaging findings. Recommend annual
screening mammography.

I have discussed the findings and recommendations with the patient.
If applicable, a reminder letter will be sent to the patient
regarding the next appointment.

BI-RADS CATEGORY  1: Negative.

## 2020-10-05 IMAGING — US US AXILLARY LEFT
1 series · 3 of 3 positions shown · non-contrast
Comparison: Previous exam(s).

CLINICAL DATA: Pain in the left axilla, radiating into the left
breast

EXAM:
DIGITAL DIAGNOSTIC BILATERAL MAMMOGRAM WITH CAD AND TOMO
ULTRASOUND LEFT BREAST

[Series 1: us axillary left · 0.06mm/px · 3 of 3 slices shown]
[im 1/3]
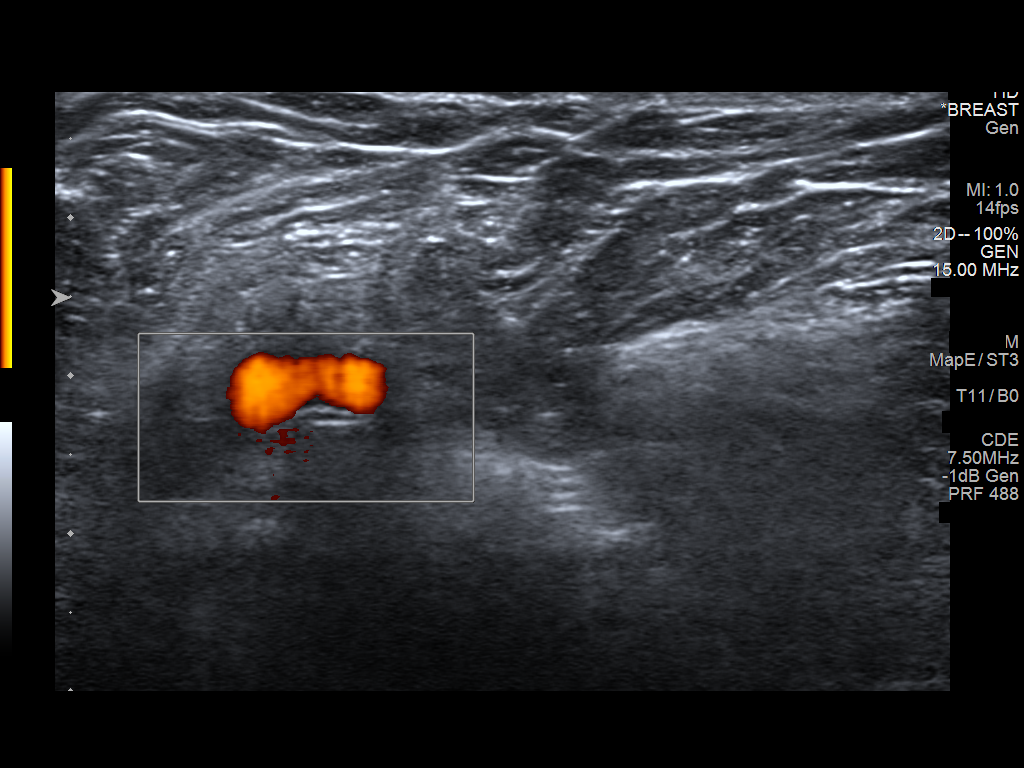
[im 2/3]
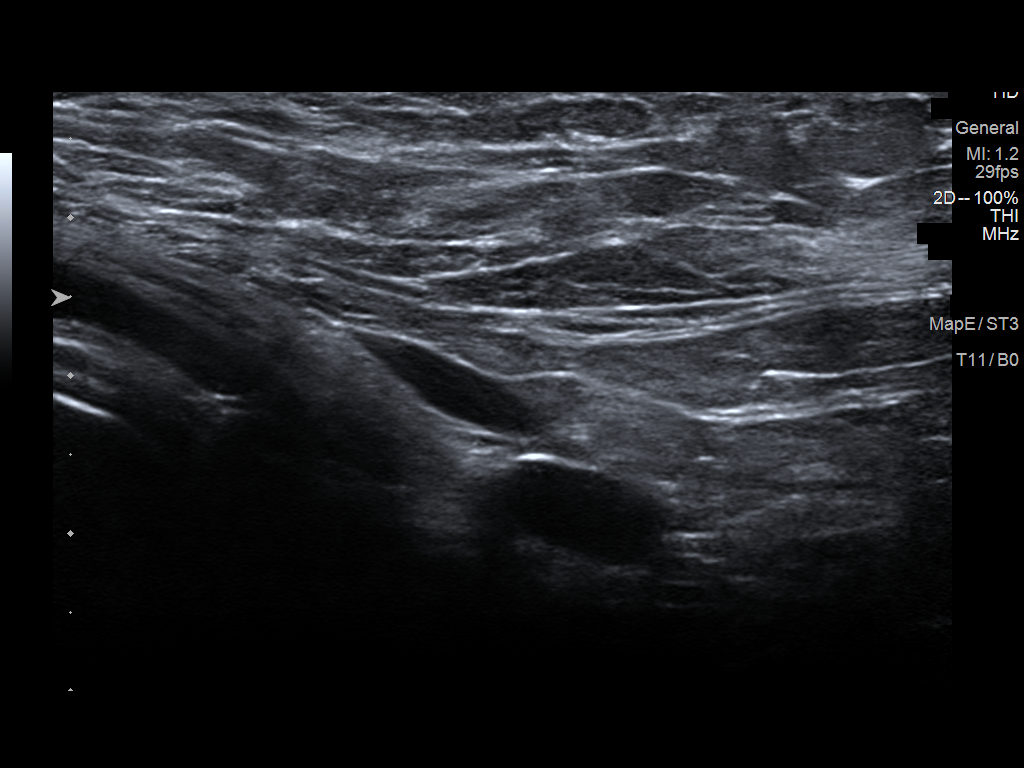
[im 3/3]
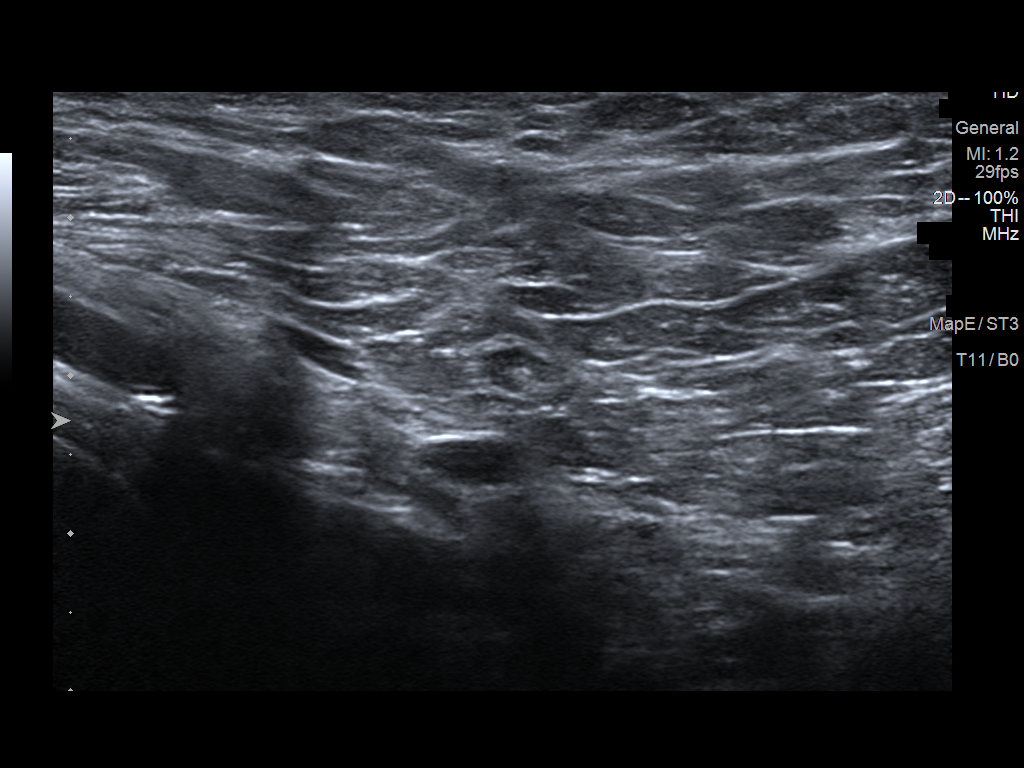

[3 of 3 positions shown; findings below may reference images not displayed]

ACR Breast Density Category c: The breast tissue is heterogeneously
dense, which may obscure small masses.
FINDINGS: No suspicious masses, calcifications, or distortion in either
breast.

Mammographic images were processed with CAD.

On physical exam, no suspicious lumps are identified.

Targeted ultrasound is performed, showing no sonographic
abnormalities.
IMPRESSION: No mammographic or sonographic evidence malignancy.

RECOMMENDATION:
Treatment of the patient's symptoms should be based on clinical and
physical exam given lack of imaging findings. Recommend annual
screening mammography.

I have discussed the findings and recommendations with the patient.
If applicable, a reminder letter will be sent to the patient
regarding the next appointment.

BI-RADS CATEGORY  1: Negative.

## 2020-12-30 ENCOUNTER — Ambulatory Visit: Payer: BC Managed Care – PPO | Admitting: Cardiology

## 2020-12-30 ENCOUNTER — Other Ambulatory Visit: Payer: Self-pay

## 2020-12-30 ENCOUNTER — Encounter: Payer: Self-pay | Admitting: Cardiology

## 2020-12-30 VITALS — BP 102/60 | HR 57 | Temp 98.4°F | Ht 64.0 in | Wt 147.6 lb

## 2020-12-30 DIAGNOSIS — R9431 Abnormal electrocardiogram [ECG] [EKG]: Secondary | ICD-10-CM

## 2020-12-30 DIAGNOSIS — R002 Palpitations: Secondary | ICD-10-CM

## 2020-12-30 DIAGNOSIS — Z86718 Personal history of other venous thrombosis and embolism: Secondary | ICD-10-CM

## 2020-12-30 NOTE — Progress Notes (Signed)
Primary Physician:  Sigmund Hazel, MD   Patient ID: Jo Mitchell, female    DOB: 10-Aug-1975, 45 y.o.   MRN: [id number]  Subjective:    Chief Complaint  Patient presents with   Palpitations   Follow-up    HPI: Jo Mitchell  is a 45 y.o. female  with hypothyroidism during pregnancy, GERD, chronic palpitations, DVT of the left calf following ACL tear in January 2017 SP Eliquis for 3 months, presents here for a 2-year visit.    The patient reports doing well overall. Her palpitations rarely occur now that she has implemented lifestyle changes like avoiding causative foods and certain medications (PPIs) or supplements. Her primary concern today is swelling in her legs that is slightly worse in the left leg below where she had a DVT. The patient reports that the swelling is worse in the evening and is worse after she has been sitting for a prolonged period of time. Elevating her legs while she is working from home helps reduce the swelling. She has not tried compression stockings. She also endorses tightness in her left calf that improves with walking.   Denies chest pain, SOB, PND, orthopnea, edema, dizziness, syncope, or symptoms suggestive of claudication or TIA.    For physical activity, the patient does 45 minutes of yoga and daily walking.   Past Medical History:  Diagnosis Date   DVT (deep venous thrombosis) (HCC) 05/25/2018   eliquis x 3 mos   Endometrial mass    HA (headache)    occassional   Heart palpitations    hx  caffeine related, cut back . - no current problems   History of hypothyroidism    postpartum 2005  -- resolved after 6 months w/ meds. resolved   Hypothyroidism    during pregnancy only   Menorrhagia    Mild acid reflux    occasional - med prn   Myoclonus    Palpitations 12/31/2018   Thyroid disease    during her pregnancy only 2005   Vertigo    Vitamin D deficiency    Wears glasses     Past Surgical History:  Procedure Laterality Date   ANAL  FISTULOTOMY N/A 10/31/2016   Procedure: ANAL FISTULOTOMY;  Surgeon: Romie Levee, MD;  Location: [facility];  Service: General;  Laterality: N/A;   CESAREAN SECTION  11-25-2003   DILATATION & CURRETTAGE/HYSTEROSCOPY WITH RESECTOCOPE N/A 06/12/2013   Procedure: DILATATION & CURETTAGE/HYSTEROSCOPY WITH RESECTOCOPE;  Surgeon: Serita Kyle, MD;  Location: [facility];  Service: Gynecology;  Laterality: N/A;   EVALUATION UNDER ANESTHESIA WITH ANAL FISTULECTOMY N/A 04/09/2013   Procedure: EXAM UNDER ANESTHESIA , FISTULOTOMY;  Surgeon: Romie Levee, MD;  Location: [facility];  Service: General;  Laterality: N/A;   INCISION AND DRAINAGE PERIRECTAL ABSCESS N/A 03/15/2013   Procedure: IRRIGATION AND DEBRIDEMENT PERIRECTAL ABSCESS;  Surgeon: Lodema Pilot, DO;  Location: [facility];  Service: General;  Laterality: N/A;   INGUINAL HERNIA REPAIR Left 1999   RECTAL EXAM UNDER ANESTHESIA N/A 10/31/2016   Procedure: ANAL EXAM UNDER ANESTHESIA;  Surgeon: Romie Levee, MD;  Location: [facility];  Service: General;  Laterality: N/A;   SHOULDER CLOSED REDUCTION Right 02/23/2019   Procedure: CLOSED MANIPULATION SHOULDER;  Surgeon: Salvatore Marvel, MD;  Location: [facility];  Service: Orthopedics;  Laterality: Right;   SHOULDER INJECTION Right 02/23/2019   Procedure: SHOULDER INJECTION;  Surgeon: Salvatore Marvel, MD;  Location: [facility];  Service: Orthopedics;  Laterality: Right;   Social History   Tobacco Use   Smoking status: Never   Smokeless tobacco: Never  Substance Use Topics   Alcohol use: No   Marital Status: Married   Family History  Problem Relation Age of Onset   Hypertension Father 44   Diabetes Father 53   Hypercholesterolemia Father 47   Hypertension Sister 68    Review of Systems  Cardiovascular:  Positive for leg swelling and palpitations (occasional). Negative for chest pain, claudication, dyspnea on exertion,  near-syncope, orthopnea, paroxysmal nocturnal dyspnea and syncope.  Respiratory:  Negative for shortness of breath and sleep disturbances due to breathing.   Neurological:  Negative for dizziness and light-headedness.   Objective:  Blood pressure 102/60, pulse (!) 57, temperature 98.4 F (36.9 C), height 5\' 4"  (1.626 m), weight 147 lb 9.6 oz (67 kg), SpO2 99 %. Body mass index is 25.34 kg/m. Vitals with BMI 12/30/2020 08/04/2020 11/02/2019  Height 5\' 4"  5\' 4"  -  Weight 147 lbs 10 oz 145 lbs -  BMI 25.32 24.88 -  Systolic 102 102 11/04/2019  Diastolic 60 70 77  Pulse 57 - 68       Physical Exam Constitutional:      General: She is not in acute distress.    Appearance: Normal appearance.  Neck:     Vascular: No carotid bruit or JVD.  Cardiovascular:     Rate and Rhythm: Normal rate and regular rhythm.     Pulses: Normal pulses and intact distal pulses.     Heart sounds: Normal heart sounds. No murmur heard.   No gallop.  Pulmonary:     Effort: Pulmonary effort is normal.     Breath sounds: Normal breath sounds.  Abdominal:     General: Bowel sounds are normal.     Palpations: Abdomen is soft.     Tenderness: There is no abdominal tenderness.  Musculoskeletal:        General: No swelling.     Right lower leg: No edema.     Left lower leg: No edema.  Neurological:     Mental Status: She is alert.  Psychiatric:        Mood and Affect: Mood normal.        Behavior: Behavior normal.        Thought Content: Thought content normal.   Radiology: No results found.  Laboratory examination:   CMP Latest Ref Rng & Units 11/02/2019 06/18/2018 06/12/2018  Glucose 70 - 99 mg/dL 11/04/2019) 06/20/2018) 06/14/2018)  BUN 6 - 20 mg/dL 11 10 7   Creatinine 0.44 - 1.00 mg/dL 952(W 413(K 440(N  Sodium 135 - 145 mmol/L 136 136 137  Potassium 3.5 - 5.1 mmol/L 3.7 3.8 3.6  Chloride 98 - 111 mmol/L 103 100 105  CO2 22 - 32 mmol/L 24 22 22   Calcium 8.9 - 10.3 mg/dL 8.9 9.2 9.0  Total Protein 6.0 - 8.5 g/dL - - -   Total Bilirubin 0.0 - 1.2 mg/dL - - -  Alkaline Phos 39 - 117 IU/L - - -  AST 0 - 40 IU/L - - -  ALT 0 - 32 IU/L - - -   CBC Latest Ref Rng & Units 11/02/2019 06/18/2018 06/12/2018  WBC 4.0 - 10.5 K/uL 5.9 5.9 7.2  Hemoglobin 12.0 - 15.0 g/dL 6.64 11/04/2019  Hematocrit 36.0 - 46.0 % 41.2 39.7 43.7  Platelets 150 - 400 K/uL 293 326 285   External labs:  Cholesterol, total 164.000 m  01/12/2020 HDL 45.000 mg 01/12/2020 LDL 107.000 m 01/12/2020 Triglycerides 60.000 mg 01/12/2020  TSH 2.000 09/25/2019   PRN Meds:. There are no discontinued medications.  Current Meds  Medication Sig   cholecalciferol (VITAMIN D3) 25 MCG (1000 UT) tablet Take 5,000 Units by mouth every other day.   vitamin B-12 (CYANOCOBALAMIN) 1000 MCG tablet Take 1,000 mcg by mouth daily.    Cardiac Studies:   Event Monitor 30 days 06-Feb-2016: Syptomatic transmission of palpitations reveal NSR.  Treadmill exercise stress test 02/24/2016: Indications: Palpitations,Abn ECG The resting electrocardiogram demonstrated normal sinus rhythm with an IRBBB, no resting arrhythmias and normal rest repolarization.  The stress electrocardiogram was normal.  There were no significant arrhythmias. Patient exercised on Bruce protocol for  9:00 minutes and achieved  91% of Max Predicted HR (Target HR was >85% MPHR) and  10.16 METS. Stress symptoms included fatigue and dyspnea. Normal BP response.  Exercise capacity was normal. Impression: Normal stress EKG.  No significant arrhythmias. Normal BP response. Abnormal EKG (R94.31)  Echocardiogram 02/23/2016: Left ventricle cavity is normal in size. Normal global wall motion. Normal diastolic filling pattern. Calculated EF 61%. Left atrial cavity is normal in size. Septal dropout, a PFO is probably present. Trace mitral regurgitation. Trace tricuspid regurgitation. No evidence of pulmonary hypertension. No significant change from 03/22/2011.  Lower Extremity Venous Duplex   06/18/2018: Right:  No evidence of common femoral vein obstruction.  Left:  Findings consistent with acute deep vein thrombosis involving the left peroneal vein.  No cystic structure found in the popliteal fossa.  All other veins visualized appear fully compressible and demonstrate appropriate Doppler characteristics.   Upper Extremity Venous duplex left arm 11/02/2019: No evidence of DVT within the left upper extremity.  Assessment:     ICD-10-CM   1. Palpitations  R00.2 EKG 12-Lead    2. Abnormal EKG  R94.31 PCV ECHOCARDIOGRAM COMPLETE    3. History of DVT of lower extremity  Z86.718 Hypercoagulable panel, comprehensive     EKG 12/30/2020: Normal sinus rhythm at rate of 59 bpm, left atrial enlargement, incomplete right bundle branch block.  T wave abnormality, inferior and anterolateral ischemia.  Abnormal EKG.  No significant change from 12/31/2018.  No change from 02/06/16.  EKG 12/31/2018: Normal sinus rhythm at rate of 63 bpm, normal axis, incomplete right branch block. 1 mm ST segment depression with T wave inversion in the inferior leads and anterolateral leads, cannot exclude ischemia. Abnormal EKG. Unchanged from EKG 02-06-16.   Recommendations:   ZANYAH LENTSCH is a 45 y.o. [place] female patient with hypothyroidism during pregnancy, GERD, chronic palpitations, DVT of the right calf following ACL tear in January 2017 SP Eliquis for 3 months, presents here for a 2-year visit.   The patient presents with concern about bilateral lower leg swelling in the evenings that is worse in the left leg where she has a history of DVT. No swelling is present during the exam today and no signs or symptoms concerning for DVT were present. Suspect venous insufficiency related to her history of DVT and dependent swelling from sitting while working from home. Patient was advised to begin wearing compression stockings as soon as she wakes up in the morning.   The patient presents with an  abnormal EKG that has been unchanged since she was last seen in 2020. She shows no signs or symptoms concerning for acute ischemic event/angina pectoris. Will obtain an echocardiogram to further evaluate her stable abnormal EKG.  The patient is concerned  about future DVT. Will obtain hypercoagulability panel to further evaluate the patient's history of DVT and concern. Patient instructed to go to [facility] for these labs.   External labs show elevated lipids. Patient advised to lose 5 pounds and to continue being active.   Follow up in 2 years for palpitations and abnormal EKG.    TintahSummer Lackey, PA-S 12/31/2020, 10:24 AM Office: [phone] Fax: [phone] Pager: [phone]   Patient seen and examined in conjunction with Ms. Charlotta NewtonSummer Lackey, [place] second year Consulting civil engineerstudent at [employer].  Time spent is in direct patient face to face encounter not including the teaching and training involved.    Yates DecampJay Trenton Verne, MD, [facility] Center[facility]ACC 12/31/2020, 10:24 AM Office: [phone]

## 2021-01-04 ENCOUNTER — Ambulatory Visit: Payer: BC Managed Care – PPO

## 2021-01-04 ENCOUNTER — Other Ambulatory Visit: Payer: Self-pay

## 2021-01-04 DIAGNOSIS — R9431 Abnormal electrocardiogram [ECG] [EKG]: Secondary | ICD-10-CM

## 2021-01-13 LAB — HYPERCOAGULABLE PANEL, COMPREHENSIVE
APTT: 27.1 s
AT III Act/Nor PPP Chro: 96 %
Act. Prt C Resist w/FV Defic.: 2.3 ratio
Anticardiolipin Ab, IgG: <10 [GPL'U]
Anticardiolipin Ab, IgM: <10 [MPL'U]
Beta-2 Glycoprotein I, IgA: <10 SAU
Beta-2 Glycoprotein I, IgG: <10 SGU
Beta-2 Glycoprotein I, IgM: <10 SMU
DRVVT Screen Seconds: 31.6 s
Factor VII Antigen**: 106 %
Factor VIII Activity: 146 %
Hexagonal Phospholipid Neutral: 0 s
Homocysteine: 7.8 umol/L
Prot C Ag Act/Nor PPP Imm: 88 %
Prot S Ag Act/Nor PPP Imm: 73 %
Protein C Ag/FVII Ag Ratio**: 0.8 ratio
Protein S Ag/FVII Ag Ratio**: 0.7 ratio

## 2021-08-09 ENCOUNTER — Ambulatory Visit: Payer: BC Managed Care – PPO | Admitting: Student

## 2022-03-15 ENCOUNTER — Ambulatory Visit: Payer: BC Managed Care – PPO | Admitting: Sports Medicine

## 2022-03-15 VITALS — BP 104/78 | Ht 64.0 in | Wt 141.0 lb

## 2022-03-15 DIAGNOSIS — M2141 Flat foot [pes planus] (acquired), right foot: Secondary | ICD-10-CM

## 2022-03-15 DIAGNOSIS — M2142 Flat foot [pes planus] (acquired), left foot: Secondary | ICD-10-CM | POA: Diagnosis not present

## 2022-03-15 DIAGNOSIS — M25561 Pain in right knee: Secondary | ICD-10-CM | POA: Diagnosis not present

## 2022-03-15 DIAGNOSIS — M25562 Pain in left knee: Secondary | ICD-10-CM

## 2022-03-15 DIAGNOSIS — G8929 Other chronic pain: Secondary | ICD-10-CM

## 2022-03-15 NOTE — Progress Notes (Signed)
   Subjective:    Patient ID: Jo Mitchell, female    DOB: February 06, 1976, 46 y.o.   MRN: [id number]  HPI chief complaint: Bilateral knee pain  Patient is a very pleasant 46 year old female that presents today with bilateral knee pain.  She has a history of an ACL tear in the left knee which occurred 3 years ago with an injury in taekwondo.  This was treated nonoperatively with physical therapy and she has done fairly well.  However, she is endorsing some lateral knee pain at the insertion of the hamstring tendons.  She has also recently begun to experience some medial knee pain.  She denies instability.  She has not noticed any swelling.  She has an appointment with Dr.Dalldorf tomorrow to discuss this.   She is also complaining of anterior right knee pain.  She also notices crunching in this knee.  It is most noticeable when navigating stairs.  Again no swelling.  No known trauma to this knee.  No prior knee surgery.  She has custom orthotics which are approximately 47 years old.  She is questioning whether or not new orthotics may be helpful for her knee pain.    Review of Systems As above    Objective:   Physical Exam  Well-developed, well-nourished.  No acute distress  Examination of her feet show pes planus with standing.  Pronation with walking.  Right knee: Full range of motion.  No effusion.  Positive J sign.  1+ patellofemoral crepitus.  Knee is stable to anterior and posterior drawer testing.  No laxity with valgus or varus stressing.  No joint line tenderness.  Left knee: Good range of motion.  No effusion.  Positive anterior drawer.  She is tender to palpation along the insertion of the biceps femoris tendon in the posterior lateral knee.  Slight tenderness to palpation along the medial joint line as well.  Negative McMurray's.      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  I want her to start doing VMO strengthening exercises daily for the right knee.  She will see Dr.Dalldorf tomorrow as scheduled for her left knee pain.  She will follow-up with me as needed.  Patient was fitted for a : standard, cushioned, semi-rigid orthotic. The orthotic was heated and afterward the patient stood on the orthotic blank positioned on the orthotic stand. The patient was positioned in subtalar neutral position and 10 degrees of ankle dorsiflexion in a weight bearing stance. After completion of molding, a stable base was applied to the orthotic blank. The blank was ground to a stable position for weight bearing. Size: 9 Base Blue EVA Posting: None Additional orthotic padding:none

## 2022-03-29 ENCOUNTER — Ambulatory Visit: Payer: BC Managed Care – PPO | Admitting: Sports Medicine

## 2022-10-18 ENCOUNTER — Ambulatory Visit: Payer: BC Managed Care – PPO | Admitting: Sports Medicine

## 2022-10-18 VITALS — BP 100/64 | Ht 64.0 in | Wt 150.0 lb

## 2022-10-18 DIAGNOSIS — M25571 Pain in right ankle and joints of right foot: Secondary | ICD-10-CM

## 2022-10-18 DIAGNOSIS — M2142 Flat foot [pes planus] (acquired), left foot: Secondary | ICD-10-CM | POA: Diagnosis not present

## 2022-10-18 DIAGNOSIS — M2141 Flat foot [pes planus] (acquired), right foot: Secondary | ICD-10-CM | POA: Diagnosis not present

## 2022-10-18 NOTE — Progress Notes (Signed)
   Subjective:    Patient ID: Jo Mitchell, female    DOB: 02-08-1976, 47 y.o.   MRN: [id number]  HPI chief complaint: Right ankle swelling  Patient presents today complaining of several weeks of lateral ankle swelling.  She states that a few months ago she had a lesion removed from the bottom of her right foot.  This caused her to alter her gait which in turn produced some lateral foot pain.  That lateral foot pain has progressively improved but she is now complaining of ankle swelling with prolonged walking.  She and her husband are [place] and she has increased her walking in preparation.  However, they had to reschedule her trip when her ankle pain and swelling developed.  They recently saw Dr. Susa Simmonds.  X-rays at his office were unremarkable per their report.  She was placed on oral prednisone which has improved her symptoms but not resolved them.  She denies any pain along the medial ankle.  She does have orthotics in her tennis shoes but they are different than the orthotics that we constructed in October.  Unfortunately, she has found those orthotics to be uncomfortable.  They resulted in the tennis shoe feeling too full resulting in pressure on the dorsum of her foot.  Interim medical history reviewed Medications reviewed Allergies reviewed  Review of Systems As above    Objective:   Physical Exam  Well-developed, well-nourished.  No acute distress  Right foot: There is some mild swelling in the sinus tarsi in the right ankle.  Slight tenderness to palpation along the course of the peroneal tendons.  No tenderness to palpation along the medial foot or ankle.  Pes planus with standing.  Good pulses.      Assessment & Plan:   Sinus tarsi syndrome, right ankle Pes planus  I recommend a body helix compression sleeve with activity including recreational walking.  I also modified the custom orthotics that she had made in our office.  I  shaved down the EVA on the bottom of the orthotic.  She may now experiment with both this pair of orthotics as well as her other orthotics and use what ever pair is most comfortable.  We also provided her with some scaphoid pads to put in other shoes that will not accommodate orthotics.  She will utilize ice postexercise.  She will increase her walking distance 10 %/week until she is at her goal.  She will follow-up with me for ongoing or recalcitrant issues.

## 2023-08-13 ENCOUNTER — Ambulatory Visit: Payer: Self-pay | Admitting: Cardiology

## 2023-09-17 ENCOUNTER — Emergency Department (HOSPITAL_COMMUNITY)

## 2023-09-17 ENCOUNTER — Other Ambulatory Visit: Payer: Self-pay

## 2023-09-17 ENCOUNTER — Emergency Department (HOSPITAL_COMMUNITY)
Admission: EM | Admit: 2023-09-17 | Discharge: 2023-09-18 | Disposition: A | Discharge status: Home / Self Care | Attending: Emergency Medicine | Admitting: Emergency Medicine

## 2023-09-17 DIAGNOSIS — H5789 Other specified disorders of eye and adnexa: Secondary | ICD-10-CM | POA: Diagnosis present

## 2023-09-17 DIAGNOSIS — H5702 Anisocoria: Secondary | ICD-10-CM | POA: Insufficient documentation

## 2023-09-17 LAB — CBC WITH DIFFERENTIAL/PLATELET
Abs Immature Granulocytes: 0.01 10*3/uL (ref 0.00–0.07)
Basophils Absolute: 0 10*3/uL (ref 0.0–0.1)
Basophils Relative: 1 %
Eosinophils Absolute: 0.1 10*3/uL (ref 0.0–0.5)
Eosinophils Relative: 1 %
HCT: 46.2 % — ABNORMAL HIGH (ref 36.0–46.0)
Hemoglobin: 15.2 g/dL — ABNORMAL HIGH (ref 12.0–15.0)
Immature Granulocytes: 0 %
Lymphocytes Relative: 24 %
Lymphs Abs: 1.4 10*3/uL (ref 0.7–4.0)
MCH: 28.8 pg (ref 26.0–34.0)
MCHC: 32.9 g/dL (ref 30.0–36.0)
MCV: 87.5 fL (ref 80.0–100.0)
Monocytes Absolute: 0.4 10*3/uL (ref 0.1–1.0)
Monocytes Relative: 7 %
Neutro Abs: 3.8 10*3/uL (ref 1.7–7.7)
Neutrophils Relative %: 67 %
Platelets: 335 10*3/uL (ref 150–400)
RBC: 5.28 MIL/uL — ABNORMAL HIGH (ref 3.87–5.11)
RDW: 12.3 % (ref 11.5–15.5)
WBC: 5.7 10*3/uL (ref 4.0–10.5)
nRBC: 0 % (ref 0.0–0.2)

## 2023-09-17 LAB — BASIC METABOLIC PANEL WITH GFR
Anion gap: 9 (ref 5–15)
BUN: 10 mg/dL (ref 6–20)
CO2: 25 mmol/L (ref 22–32)
Calcium: 9.2 mg/dL (ref 8.9–10.3)
Chloride: 105 mmol/L (ref 98–111)
Creatinine, Ser: 0.69 mg/dL (ref 0.44–1.00)
GFR, Estimated: >60 mL/min (ref >60)
Glucose, Bld: 105 mg/dL — ABNORMAL HIGH (ref 70–99)
Potassium: 4.4 mmol/L (ref 3.5–5.1)
Sodium: 139 mmol/L (ref 135–145)

## 2023-09-17 MED ORDER — GADOBUTROL 1 MMOL/ML IV SOLN
7.0000 mL | Freq: Once | INTRAVENOUS | Status: AC | PRN
  Administered 2023-09-17: 7 mL via INTRAVENOUS

## 2023-09-17 NOTE — ED Provider Triage Note (Signed)
 Emergency Medicine Provider Triage Evaluation Note  GWENDOLY BALDWIN , a 48 y.o. female  was evaluated in triage.  Pt complains of loss of vision in left eye on Sunday.  Pupil larger.  Pt see by neurology and sent to ED for MRi orbit and brain  Review of Systems  Positive: Normal vision now Negative: No weakness  Physical Exam  BP (!) 163/88 (BP Location: Right Arm)   Pulse 71   Temp 99.3 F (37.4 C)   Resp 16   Wt 68 kg   SpO2 99%   BMI 25.73 kg/m  Gen:   Awake, no distress   Resp:  Normal effort  MSK:   Moves extremities without difficulty  Other:    Medical Decision Making  Medically screening exam initiated at 6:06 PM.  Appropriate orders placed.  RAIZA FOELL was informed that the remainder of the evaluation will be completed by another provider, this initial triage assessment does not replace that evaluation, and the importance of remaining in the ED until their evaluation is complete.     Sandi Crosby, PA-C 09/17/23 1810

## 2023-09-17 NOTE — ED Notes (Signed)
 Pt resting, no distress noted. Awaiting CT scan. Denies needs. Spouse at bedside.

## 2023-09-17 NOTE — Discharge Instructions (Signed)
 You were seen in the emergency department for further testing given abnormal eye exam findings of a left pupil asymmetry The MRI of the brain and orbits as well as the CTA of the head and neck did not show any concerning findings that would be consistent with stroke or compression of the cranial nerve It is important that you follow-up with your eye doctor to discuss further management You can also follow-up with neurology at the number listed above if your symptoms persist Return to the emergency department for severe headaches, changes in vision, weakness on one side of your body or any other concerns

## 2023-09-17 NOTE — ED Provider Notes (Signed)
  Provider Note MRN:  [id number]  Arrival date & time: 09/18/23    ED Course and Medical Decision Making  Assumed care of patient at sign-out or upon transfer.  Left pupil abnormality preceded by transient unilateral vision loss.  MRI imaging reassuring, awaiting CTA to evaluate for P-comm aneurysm.  If without acute findings would be appropriate for discharge and neurology follow-up.  Procedures  Final Clinical Impressions(s) / ED Diagnoses     ICD-10-CM   1. Pupil asymmetry  H57.02       ED Discharge Orders     None         Discharge Instructions      You were seen in the emergency department for further testing given abnormal eye exam findings of a left pupil asymmetry The MRI of the brain and orbits as well as the CTA of the head and neck did not show any concerning findings that would be consistent with stroke or compression of the cranial nerve It is important that you follow-up with your eye doctor to discuss further management You can also follow-up with neurology at the number listed above if your symptoms persist Return to the emergency department for severe headaches, changes in vision, weakness on one side of your body or any other concerns    Merrick Abe. Harless Lien, MD [facility] Emergency Medicine [facility] [email or web address]    Edson Graces, MD 09/18/23 [phone]

## 2023-09-17 NOTE — ED Provider Notes (Signed)
 [place] [facility] Provider Note   CSN: [id number] Arrival date & time: 09/17/23  1705     History  Chief Complaint  Patient presents with   Eye Problem    Jo Mitchell is a 48 y.o. female.  With a history of hypothyroidism who presents to the ED for changes in vision.  Patient first experienced temporary painless vision loss in her left eye 3 days ago.  Her vision returned spontaneously over a period of several hours however since that time the left pupil has been asymmetrically dilated.  For this reason when she was seen earlier today by her primary care doctor and by an ophthalmologist.  Pupil has remained dilated in the left but patient states her vision is normal aside from perceiving brighter light in the left eye.  Intraocular pressures were normal.  She was directed here given concern for potential stroke or compressive lesion of cranial nerve III and was sent in for an MRI of the brain and orbits.  Aside from persistently asymmetric left pupil she has not had any headaches, fevers, or other neurologic symptoms.  She is not on oral contraceptives.  No history of similar symptoms in the past.  She does have headaches from time to time but these are not positional and she attributes them to sinus congestion.  They typically improve after she lays down.   Eye Problem      Home Medications Prior to Admission medications   Medication Sig Start Date End Date Taking? Authorizing Provider  cholecalciferol (VITAMIN D3) 25 MCG (1000 UT) tablet Take 5,000 Units by mouth every other day.    [provider]  vitamin B-12 (CYANOCOBALAMIN) 1000 MCG tablet Take 1,000 mcg by mouth daily.    [provider]      Allergies    Amoxicillin , Azithromycin, Dexilant [dexlansoprazole], Lodine [etodolac], and Nsaids    Review of Systems   Review of Systems  Physical Exam Updated Vital Signs BP 138/85   Pulse (!) 54   Temp 98.6 F (37  C)   Resp 14   Wt 68 kg   SpO2 100%   BMI 25.73 kg/m  Physical Exam Vitals and nursing note reviewed.  HENT:     Head: Normocephalic and atraumatic.  Eyes:     Comments: Pupil exam after dilation earlier today Right pupil is 4 mm round reactive to light Left pupil is 6 mm nonreactive  Cardiovascular:     Rate and Rhythm: Normal rate and regular rhythm.  Pulmonary:     Effort: Pulmonary effort is normal.     Breath sounds: Normal breath sounds.  Abdominal:     Palpations: Abdomen is soft.     Tenderness: There is no abdominal tenderness.  Musculoskeletal:     Cervical back: Neck supple. No rigidity or tenderness.  Skin:    General: Skin is warm and dry.  Neurological:     General: No focal deficit present.     Mental Status: She is alert and oriented to person, place, and time.     Cranial Nerves: No cranial nerve deficit.     Sensory: No sensory deficit.     Motor: No weakness.  Psychiatric:        Mood and Affect: Mood normal.     ED Results / Procedures / Treatments   Labs (all labs ordered are listed, but only abnormal results are displayed) Labs Reviewed  CBC WITH DIFFERENTIAL/PLATELET - Abnormal; Notable for  the following components:      Result Value   RBC 5.28 (*)    Hemoglobin 15.2 (*)    HCT 46.2 (*)    All other components within normal limits  BASIC METABOLIC PANEL WITH GFR - Abnormal; Notable for the following components:   Glucose, Bld 105 (*)    All other components within normal limits    EKG None  Radiology MR ORBITS W WO CONTRAST Result Date: 09/17/2023 CLINICAL DATA:  Initial evaluation for acute vision loss. EXAM: MRI HEAD AND ORBITS WITHOUT AND WITH CONTRAST TECHNIQUE: Multiplanar, multiecho pulse sequences of the brain and surrounding structures were obtained without and with intravenous contrast. Multiplanar, multiecho pulse sequences of the orbits and surrounding structures were obtained including fat saturation techniques, before and  after intravenous contrast administration. CONTRAST:  7mL GADAVIST GADOBUTROL 1 MMOL/ML IV SOLN COMPARISON:  None Available. FINDINGS: MRI HEAD FINDINGS Brain: Cerebral volume within normal limits. Few subcentimeter foci of T2/FLAIR hyperintensity noted involving the deep and subcortical white matter of the anterior frontal lobes bilaterally, nonspecific, but overall minimal in nature, and less than is typically seen for age. No evidence for acute or subacute infarct. No areas of chronic cortical infarction. No acute or chronic intracranial blood products. No mass lesion, midline shift or mass effect. No hydrocephalus or extra-axial fluid collection. Empty sella noted.  Suprasellar region within normal limits. No abnormal enhancement. Vascular: Major intracranial vascular flow voids are maintained. Skull and upper cervical spine: Craniocervical junction within normal limits. Bone marrow signal intensity normal. No scalp soft tissue abnormality. Other: No mastoid effusion. MRI ORBITS FINDINGS Orbits: Globes are symmetric in size with normal appearance and morphology. Optic nerves symmetric and within normal limits. No abnormal edema or enhancement about either optic nerve to suggest acute optic neuritis. No abnormality about either optic nerve sheath complex. Intraconal and extraconal fat maintained. Extra-ocular muscles symmetric and normal. Lacrimal glands normal. No abnormality about the orbital apices or cavernous sinus. Optic chiasm normally situated within the suprasellar cistern. Visualized sinuses: Visualized paranasal sinuses are largely clear. Soft tissues: Unremarkable. IMPRESSION: MRI HEAD: 1. Normal brain MRI for age. No acute intracranial abnormality identified. 2. Empty sella. While this finding is often incidental in nature and of no clinical significance, this can also be seen in the setting of idiopathic intracranial hypertension. MRI ORBITS: Normal MRI of the orbits. No acute abnormality  identified. Electronically Signed   By: Virgia Griffins M.D.   On: 09/17/2023 20:14   MR BRAIN W WO CONTRAST Result Date: 09/17/2023 CLINICAL DATA:  Initial evaluation for acute vision loss. EXAM: MRI HEAD AND ORBITS WITHOUT AND WITH CONTRAST TECHNIQUE: Multiplanar, multiecho pulse sequences of the brain and surrounding structures were obtained without and with intravenous contrast. Multiplanar, multiecho pulse sequences of the orbits and surrounding structures were obtained including fat saturation techniques, before and after intravenous contrast administration. CONTRAST:  7mL GADAVIST GADOBUTROL 1 MMOL/ML IV SOLN COMPARISON:  None Available. FINDINGS: MRI HEAD FINDINGS Brain: Cerebral volume within normal limits. Few subcentimeter foci of T2/FLAIR hyperintensity noted involving the deep and subcortical white matter of the anterior frontal lobes bilaterally, nonspecific, but overall minimal in nature, and less than is typically seen for age. No evidence for acute or subacute infarct. No areas of chronic cortical infarction. No acute or chronic intracranial blood products. No mass lesion, midline shift or mass effect. No hydrocephalus or extra-axial fluid collection. Empty sella noted.  Suprasellar region within normal limits. No abnormal enhancement. Vascular: Major intracranial vascular  flow voids are maintained. Skull and upper cervical spine: Craniocervical junction within normal limits. Bone marrow signal intensity normal. No scalp soft tissue abnormality. Other: No mastoid effusion. MRI ORBITS FINDINGS Orbits: Globes are symmetric in size with normal appearance and morphology. Optic nerves symmetric and within normal limits. No abnormal edema or enhancement about either optic nerve to suggest acute optic neuritis. No abnormality about either optic nerve sheath complex. Intraconal and extraconal fat maintained. Extra-ocular muscles symmetric and normal. Lacrimal glands normal. No abnormality about the  orbital apices or cavernous sinus. Optic chiasm normally situated within the suprasellar cistern. Visualized sinuses: Visualized paranasal sinuses are largely clear. Soft tissues: Unremarkable. IMPRESSION: MRI HEAD: 1. Normal brain MRI for age. No acute intracranial abnormality identified. 2. Empty sella. While this finding is often incidental in nature and of no clinical significance, this can also be seen in the setting of idiopathic intracranial hypertension. MRI ORBITS: Normal MRI of the orbits. No acute abnormality identified. Electronically Signed   By: Virgia Griffins M.D.   On: 09/17/2023 20:14    Procedures Procedures    Medications Ordered in ED Medications  gadobutrol (GADAVIST) 1 MMOL/ML injection 7 mL (7 mLs Intravenous Contrast Given 09/17/23 1939)    ED Course/ Medical Decision Making/ A&P Clinical Course as of 09/17/23 2336  Tue Sep 17, 2023  2336 I, Rafael Bun DO, am transitioning care of this patient to the oncoming provider pending CTA head and neck, reevaluation and disposition [MP]    Clinical Course User Index [MP] Sallyanne Creamer, DO                                 Medical Decision Making 47 year old female with history as above sent in from ophthalmology office given concern for asymmetric dilation of the left pupil nonreactive to light.  No other focal neurologic symptoms.  No reported headaches at this time.  She did have an episode of transient vision loss about 3 days ago but vision is grossly normal now.  Differential diagnosis includes intracranial lesion, cranial nerve III compression, intracranial aneurysm, amaurosis fugax, and TIA/stroke.  She has not had any pain in this eye.  Low suspicion for central retinal arterial occlusion or central retinal vein occlusion.  She is not obese and there are no positional headaches.  Low suspicion for IIH.  Obtain laboratory workup as well as recommended test of MRI brain and orbits.  MRI brain and orbits were  normal overall with finding of empty sella.  I discussed this finding with Dr.Arora (neurology) who was reviewed the patient's case, imaging and spoken with neuroradiologist as well.  Given clinical history he has low suspicion for IIH at this time and does not recommend lumbar puncture.  He does however recommend CTA head and neck to look for P-comm aneurysm which could be potentially compressing cranial nerve III and causing the patient's symptoms.  I have ordered this test and inform the patient of this plan.  Amount and/or Complexity of Data Reviewed Radiology: ordered.           Final Clinical Impression(s) / ED Diagnoses Final diagnoses:  Pupil asymmetry    Rx / DC Orders ED Discharge Orders     None         Sallyanne Creamer, DO 09/17/23 2336

## 2023-09-17 NOTE — ED Triage Notes (Signed)
 Pt sent over from [facility] for MRI of brain and orbits to rule out compressive lesion of CN3 as well as MRA. Pt had brief moment of vision loss in left eye on Sunday after noticed pupil was twice as large on left as the right. Denies any pain or injury. Yesterday and today has not had any vision changes but pupil remained larger.

## 2023-09-18 ENCOUNTER — Emergency Department (HOSPITAL_COMMUNITY)

## 2023-09-18 MED ORDER — IOHEXOL 350 MG/ML SOLN
75.0000 mL | Freq: Once | INTRAVENOUS | Status: AC | PRN
  Administered 2023-09-18: 75 mL via INTRAVENOUS

## 2023-09-18 NOTE — ED Notes (Signed)
 MD at bedside. Will DC once he's finished.

## 2023-09-18 NOTE — ED Notes (Signed)
 Pt/fam wanting to chat with provider before DC.

## 2023-09-18 NOTE — ED Notes (Signed)
 Pt taken to CT.

## 2023-09-18 NOTE — ED Notes (Signed)
 Pt back from CT

## 2024-01-08 ENCOUNTER — Encounter: Payer: Self-pay | Admitting: Neurology

## 2024-01-08 ENCOUNTER — Ambulatory Visit: Admitting: Neurology

## 2024-01-08 VITALS — BP 124/80 | HR 67 | Ht 64.0 in | Wt 153.2 lb

## 2024-01-08 DIAGNOSIS — H57052 Tonic pupil, left eye: Secondary | ICD-10-CM | POA: Diagnosis not present

## 2024-01-08 NOTE — Patient Instructions (Signed)
 Follow up as needed

## 2024-01-08 NOTE — Progress Notes (Unsigned)
 [facility]    Provider:  Dr Ines Requesting Provider: Delayne Artist PARAS, MD Primary Care Provider:  Cleotilde Planas, MD  CC:  Pupil symmtry  HPI:  Jo Mitchell is a 48 y.o. female here as requested by Delayne Artist PARAS, MD for empty sella syndrome. 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 Here with husband. Went to the ophthalmoloist for some vision changes, momentarily she had blurry vision just out of the eye and her pupil was very large, the next day every day was fine, she went to the doctor, then to the ophthalmologst and then to the ED. Her left eye was dilated, she went to the ED, vision was fine, no papilledema, went to the ED, imaging unremarkable, she was diagnosed with allergy medication or adie's tonic pupil. Since then resolved. No headache. She has followup with ophthalmology in November. She has migraines and can be on either side. No increase in migraines or headaches, no changes I freq and severity, no change in hearing, no positional or exertional headaches, she has alllergry related headache occ or with heat ongoing for many years chronic tylenol  will help and sleep.  Neurology stated not related to empty sella in the ED. When she has the symptoms near vision is better than far vision   Reviewed notes, labs and imaging from outside physicians, which showed ***  Review of Systems: Patient complains of symptoms per HPI as well as the following symptoms ***. Pertinent negatives and positives per HPI. All others negative.   Social History   Socioeconomic History   Marital status: Married    Spouse name: Not on file   Number of children: 1   Years of education: 12+   Highest education level: Not on file  Occupational History   Occupation: Consulting civil engineer   Tobacco Use   Smoking status: Never   Smokeless tobacco: Never  Vaping Use   Vaping status: Never Used  Substance and Sexual Activity   Alcohol use: No    Drug use: No   Sexual activity: Yes    Birth control/protection: Condom  Other Topics Concern   Not on file  Social History Narrative   Patient lives at home with husband. 1 child   Student obtaining PhD   Patient is right handed.    Patient does not drink any caffeine.         Social Drivers of Corporate investment banker Strain: Not on file  Food Insecurity: Not on file  Transportation Needs: Not on file  Physical Activity: Not on file  Stress: Not on file  Social Connections: Not on file  Intimate Partner Violence: Not on file    Family History  Problem Relation Age of Onset   Hypertension Father 87   Diabetes Father 11   Hypercholesterolemia Father 75   Hypertension Sister 54    Past Medical History:  Diagnosis Date   DVT (deep venous thrombosis) (HCC) 05/25/2018   eliquis x 3 mos   Endometrial mass    HA (headache)    occassional   Heart palpitations    hx  caffeine related, cut back . - no current problems   History of hypothyroidism    postpartum 2005  -- resolved after 6 months w/ meds. 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    Past Surgical History:  Procedure Laterality Date   ANAL FISTULOTOMY N/A 10/31/2016   Procedure: ANAL FISTULOTOMY;  Surgeon: Debby Hila, MD;  Location: [facility];  Service: General;  Laterality: N/A;   CESAREAN SECTION  11-25-2003   DILATATION & CURRETTAGE/HYSTEROSCOPY WITH RESECTOCOPE N/A 06/12/2013   Procedure: DILATATION & CURETTAGE/HYSTEROSCOPY WITH RESECTOCOPE;  Surgeon: Dickie DELENA Carder, MD;  Location: [facility];  Service:  Gynecology;  Laterality: N/A;   EVALUATION UNDER ANESTHESIA WITH ANAL FISTULECTOMY N/A 04/09/2013   Procedure: EXAM UNDER ANESTHESIA , FISTULOTOMY;  Surgeon: Hila Debby, MD;  Location: [facility] [place];  Service: General;  Laterality: N/A;   INCISION AND DRAINAGE PERIRECTAL ABSCESS N/A 03/15/2013   Procedure: IRRIGATION AND DEBRIDEMENT PERIRECTAL ABSCESS;  Surgeon: Redell Faith, DO;  Location: [facility];  Service: General;  Laterality: N/A;   INGUINAL HERNIA REPAIR Left 1999   RECTAL EXAM UNDER ANESTHESIA N/A 10/31/2016   Procedure: ANAL EXAM UNDER ANESTHESIA;  Surgeon: Debby Hila, MD;  Location: [facility] [place];  Service: General;  Laterality: N/A;   SHOULDER CLOSED REDUCTION Right 02/23/2019   Procedure: CLOSED MANIPULATION SHOULDER;  Surgeon: Jane Charleston, MD;  Location: [facility];  Service: Orthopedics;  Laterality: Right;   SHOULDER INJECTION Right 02/23/2019   Procedure: SHOULDER INJECTION;  Surgeon: Jane Charleston, MD;  Location: [facility];  Service: Orthopedics;  Laterality: Right;    Current Outpatient Medications  Medication Sig Dispense Refill   cholecalciferol (VITAMIN D3) 25 MCG (1000 UT) tablet Take 5,000 Units by mouth every other day.     vitamin B-12 (CYANOCOBALAMIN) 1000 MCG tablet Take 1,000 mcg by mouth daily.     No current facility-administered medications for this visit.    Allergies as of 01/08/2024 - Review Complete 01/08/2024  Allergen Reaction Noted   Amoxicillin  Diarrhea and Other (See Comments) 04/07/2013   Azithromycin Other (See Comments) 03/11/2013   Dexilant [dexlansoprazole] Other (See Comments) 09/17/2013   Lodine [etodolac] Palpitations 09/17/2013   Nsaids Other (See Comments) 02/18/2019    Vitals: BP 124/80 (BP Location: Right Arm, Patient Position: Sitting, Cuff Size: Normal)   Pulse 67   Ht 5' 4 (1.626 m)   Wt 153 lb 3.2 oz (69.5 kg)   BMI 26.30 kg/m  Last Weight:  Wt Readings from  Last 1 Encounters:  01/08/24 153 lb 3.2 oz (69.5 kg)   Last Height:   Ht Readings from Last 1 Encounters:  01/08/24 5' 4 (1.626 m)   Left eye not constricting as effectively, absent AJs. Left poorly reacts to light.   Physical exam: Exam: Gen: NAD, conversant, well nourised, obese, well groomed                     CV: RRR, no MRG. No Carotid Bruits. No peripheral edema, warm, nontender Eyes: Conjunctivae clear without exudates or hemorrhage  Neuro: Detailed Neurologic Exam  Speech:    Speech is normal; fluent and spontaneous with normal comprehension.  Cognition:    The patient is oriented to person, place, and time;     recent and remote memory intact;     language fluent;  normal attention, concentration,     fund of knowledge Cranial Nerves:    The pupils are equal, round, and reactive to light. Left pupil 2-3 mm and right The fundi are normal and spontaneous venous pulsations are present. Visual fields are full to finger confrontation. Extraocular movements are intact. Trigeminal sensation is intact and the muscles of mastication are normal. The face is symmetric. The palate elevates in the midline. Hearing intact. Voice is normal. Shoulder shrug is normal. The tongue has normal motion without fasciculations.   Coordination:    Normal finger to nose and heel to shin. Normal rapid alternating movements.   Gait:    Heel-toe and tandem gait are normal.   Motor Observation:    No asymmetry, no atrophy, and no involuntary movements noted. Tone:    Normal muscle tone.    Posture:    Posture is normal. normal erect    Strength:    Strength is V/V in the upper and lower limbs.      Sensation: intact to LT     Reflex Exam:  DTR's:    Deep tendon reflexes in the upper and lower extremities are normal bilaterally.   Toes:    The toes are downgoing bilaterally.   Clonus:    Clonus is absent.    Assessment/Plan:    No orders of the defined types were placed in  this encounter.  No orders of the defined types were placed in this encounter.   Cc: Delayne Artist PARAS, MD,  Cleotilde Planas, MD  Onetha Epp, MD  [facility] [address] [place], [place] [address]  Phone [phone] Fax [phone]

## 2024-01-09 DIAGNOSIS — H57052 Tonic pupil, left eye: Secondary | ICD-10-CM | POA: Insufficient documentation
